# Patient Record
Sex: Male | Born: 1980 | Race: Black or African American | Hispanic: No | Marital: Single | Smoking: Never smoker
Health system: Southern US, Community
[De-identification: ages and names within clinical notes are randomized; demographics above are authoritative.]

## PROBLEM LIST (undated history)

## (undated) DIAGNOSIS — F32A Depression, unspecified: Secondary | ICD-10-CM

## (undated) DIAGNOSIS — I471 Supraventricular tachycardia, unspecified: Secondary | ICD-10-CM

## (undated) DIAGNOSIS — F419 Anxiety disorder, unspecified: Secondary | ICD-10-CM

## (undated) DIAGNOSIS — K5792 Diverticulitis of intestine, part unspecified, without perforation or abscess without bleeding: Secondary | ICD-10-CM

## (undated) HISTORY — DX: Depression, unspecified: F32.A

## (undated) HISTORY — DX: Anxiety disorder, unspecified: F41.9

## (undated) HISTORY — DX: Diverticulitis of intestine, part unspecified, without perforation or abscess without bleeding: K57.92

---

## 2006-11-12 ENCOUNTER — Emergency Department (HOSPITAL_COMMUNITY): Admission: EM | Admit: 2006-11-12 | Discharge: 2006-11-12 | Payer: Self-pay | Admitting: Emergency Medicine

## 2015-01-14 ENCOUNTER — Emergency Department (HOSPITAL_COMMUNITY)
Admission: EM | Admit: 2015-01-14 | Discharge: 2015-01-14 | Disposition: A | Discharge status: Home / Self Care | Payer: BLUE CROSS/BLUE SHIELD | Source: Home / Self Care | Attending: Family Medicine | Admitting: Family Medicine

## 2015-01-14 ENCOUNTER — Encounter (HOSPITAL_COMMUNITY): Payer: Self-pay | Admitting: Emergency Medicine

## 2015-01-14 DIAGNOSIS — M5412 Radiculopathy, cervical region: Secondary | ICD-10-CM

## 2015-01-14 DIAGNOSIS — R42 Dizziness and giddiness: Secondary | ICD-10-CM

## 2015-01-14 MED ORDER — PREDNISONE 10 MG (48) PO TBPK: ORAL_TABLET | Freq: Every day | ORAL | Status: DC

## 2015-01-14 NOTE — Discharge Instructions (Signed)
Thank you for coming in today. Come back or go to the emergency room if you notice new weakness new numbness problems walking or bowel or bladder problems. Call or go to the emergency room if you get worse, have trouble breathing, have chest pains, or palpitations.    Follow-up with me in [place] in July or at [facility] urgent care sooner if needed.   Cervical Radiculopathy Cervical radiculopathy happens when a nerve in the neck is pinched or bruised by a slipped (herniated) disk or by arthritic changes in the bones of the cervical spine. This can occur due to an injury or as part of the normal aging process. Pressure on the cervical nerves can cause pain or numbness that runs from your neck all the way down into your arm and fingers. CAUSES  There are many possible causes, including:  Injury.  Muscle tightness in the neck from overuse.  Swollen, painful joints (arthritis).  Breakdown or degeneration in the bones and joints of the spine (spondylosis) due to aging.  Bone spurs that may develop near the cervical nerves. SYMPTOMS  Symptoms include pain, weakness, or numbness in the affected arm and hand. Pain can be severe or irritating. Symptoms may be worse when extending or turning the neck. DIAGNOSIS  Your caregiver will ask about your symptoms and do a physical exam. He or she may test your strength and reflexes. X-rays, CT scans, and MRI scans may be needed in cases of injury or if the symptoms do not go away after a period of time. Electromyography (EMG) or nerve conduction testing may be done to study how your nerves and muscles are working. TREATMENT  Your caregiver may recommend certain exercises to help relieve your symptoms. Cervical radiculopathy can, and often does, get better with time and treatment. If your problems continue, treatment options may include:  Wearing a soft collar for short periods of time.  Physical therapy to strengthen the neck muscles.  Medicines,  such as nonsteroidal anti-inflammatory drugs (NSAIDs), oral corticosteroids, or spinal injections.  Surgery. Different types of surgery may be done depending on the cause of your problems. HOME CARE INSTRUCTIONS   Put ice on the affected area.  Put ice in a plastic bag.  Place a towel between your skin and the bag.  Leave the ice on for 15-20 minutes, 03-04 times a day or as directed by your caregiver.  If ice does not help, you can try using heat. Take a warm shower or bath, or use a hot water bottle as directed by your caregiver.  You may try a gentle neck and shoulder massage.  Use a flat pillow when you sleep.  Only take over-the-counter or prescription medicines for pain, discomfort, or fever as directed by your caregiver.  If physical therapy was prescribed, follow your caregiver's directions.  If a soft collar was prescribed, use it as directed. SEEK IMMEDIATE MEDICAL CARE IF:   Your pain gets much worse and cannot be controlled with medicines.  You have weakness or numbness in your hand, arm, face, or leg.  You have a high fever or a stiff, rigid neck.  You lose bowel or bladder control (incontinence).  You have trouble with walking, balance, or speaking. MAKE SURE YOU:   Understand these instructions.  Will watch your condition.  Will get help right away if you are not doing well or get worse. Document Released: 04/22/2001 Document Revised: 10/20/2011 Document Reviewed: 03/11/2011 [facility] Patient Information 2015 [employer] [place]. This information is  not intended to replace advice given to you by your health care provider. Make sure you discuss any questions you have with your health care provider.

## 2015-01-14 NOTE — ED Provider Notes (Signed)
Oscar Young is a 34 y.o. male who presents to Urgent Care today for left-sided neck pain. Pain present for 2 weeks and is mild. He has a numb tingly sensation radiating to his left thumb intermittently. He denies any weakness or frank pain. Symptoms are mild to moderate. He has not tried any medications yet. No fevers or chills nausea vomiting or diarrhea.   History reviewed. No pertinent past medical history. History reviewed. No pertinent past surgical history. History  Substance Use Topics  . Smoking status: Never Smoker   . Smokeless tobacco: Not on file  . Alcohol Use: No   ROS as above Medications: No current facility-administered medications for this encounter.   Current Outpatient Prescriptions  Medication Sig Dispense Refill  . predniSONE (STERAPRED UNI-PAK 48 TAB) 10 MG (48) TBPK tablet Take by mouth daily. 48 tablet 0   No Known Allergies   Exam:  BP 115/69 mmHg  Pulse 75  Temp(Src) 97.9 F (36.6 C) (Oral)  Resp 16  SpO2 100% Gen: Well NAD HEENT: EOMI,  MMM Lungs: Normal work of breathing. CTABL Heart: RRR no MRG Abd: NABS, Soft. Nondistended, Nontender Exts: Brisk capillary refill, warm and well perfused.  Neck: Nontender to midline. Normal neck range of motion negative Spurling's test. Upper extremity strength is equal and normal throughout. Reflexes are intact and equal bilateral upper extremities. Sensation is intact throughout. Pulses intact bilateral wrists.  No results found for this or any previous visit (from the past 24 hour(s)). No results found.  Assessment and Plan: 34 y.o. male with cervical radiculopathy or paresthesias. Discussed options. Treat with prednisone. Follow up with sports medicine if not improved.  Discussed warning signs or symptoms. Please see discharge instructions. Patient expresses understanding.     Rodolph BongEvan S Anija Brickner, MD 01/14/15 1910

## 2015-01-14 NOTE — ED Notes (Signed)
C/o left side neck pain that radiates down to left arm on set 1-2 weeks Denies inj/trauma Denies CP, SOB, weakness Alert, no signs of acute distress.

## 2015-12-13 ENCOUNTER — Emergency Department (HOSPITAL_COMMUNITY): Payer: BLUE CROSS/BLUE SHIELD

## 2015-12-13 ENCOUNTER — Emergency Department (HOSPITAL_COMMUNITY)
Admission: EM | Admit: 2015-12-13 | Discharge: 2015-12-13 | Disposition: A | Discharge status: Home / Self Care | Payer: BLUE CROSS/BLUE SHIELD | Attending: Emergency Medicine | Admitting: Emergency Medicine

## 2015-12-13 ENCOUNTER — Encounter (HOSPITAL_COMMUNITY): Payer: Self-pay

## 2015-12-13 DIAGNOSIS — Z7952 Long term (current) use of systemic steroids: Secondary | ICD-10-CM | POA: Diagnosis not present

## 2015-12-13 DIAGNOSIS — R002 Palpitations: Secondary | ICD-10-CM | POA: Diagnosis not present

## 2015-12-13 DIAGNOSIS — R42 Dizziness and giddiness: Secondary | ICD-10-CM | POA: Insufficient documentation

## 2015-12-13 LAB — BASIC METABOLIC PANEL
Anion gap: 10 (ref 5–15)
BUN: 10 mg/dL (ref 6–20)
CALCIUM: 9.5 mg/dL (ref 8.9–10.3)
CHLORIDE: 106 mmol/L (ref 101–111)
CO2: 23 mmol/L (ref 22–32)
CREATININE: 0.94 mg/dL (ref 0.61–1.24)
GFR calc Af Amer: >60 mL/min (ref >60)
GFR calc non Af Amer: >60 mL/min (ref >60)
Glucose, Bld: 99 mg/dL (ref 65–99)
Potassium: 3.9 mmol/L (ref 3.5–5.1)
Sodium: 139 mmol/L (ref 135–145)

## 2015-12-13 LAB — CBC
HCT: 49.5 % (ref 39.0–52.0)
HEMOGLOBIN: 16.4 g/dL (ref 13.0–17.0)
MCH: 30.1 pg (ref 26.0–34.0)
MCHC: 33.1 g/dL (ref 30.0–36.0)
MCV: 91 fL (ref 78.0–100.0)
PLATELETS: 176 10*3/uL (ref 150–400)
RBC: 5.44 MIL/uL (ref 4.22–5.81)
RDW: 13.9 % (ref 11.5–15.5)
WBC: 5.3 10*3/uL (ref 4.0–10.5)

## 2015-12-13 LAB — I-STAT TROPONIN, ED: TROPONIN I, POC: 0 ng/mL (ref 0.00–0.08)

## 2015-12-13 MED ORDER — METOPROLOL TARTRATE 25 MG PO TABS
25.0000 mg | ORAL_TABLET | Freq: Once | ORAL | Status: AC
  Administered 2015-12-13: 25 mg via ORAL
  Filled 2015-12-13: qty 1

## 2015-12-13 MED ORDER — METOPROLOL SUCCINATE ER 25 MG PO TB24: 25.0000 mg | ORAL_TABLET | Freq: Every day | ORAL | Status: DC

## 2015-12-13 NOTE — ED Notes (Signed)
MD at bedside. 

## 2015-12-13 NOTE — ED Notes (Signed)
Pt reports he is here with c/o heart palpitations and dizziness. He states this has been on and off since last year and [facility] and was told he could have been in SVT and was told to wear a heart monitor last month but has not been back to see cardiology since he stopped wearing the monitor. Palpitations started again today and only lasted about 15 - 30 seconds.

## 2015-12-13 NOTE — Discharge Instructions (Signed)

## 2015-12-13 NOTE — ED Provider Notes (Signed)
CSN: [id number]     Arrival date & time 12/13/15  1819 History   First MD Initiated Contact with Patient 12/13/15 2136     Chief Complaint  Patient presents with  . Palpitations      HPI Patient presents to the emergency department with acute onset palpitations that lasted 15-20 seconds.  He felt a sensation of his heart fluttering and made him lightheaded.  No syncope.  He has been seen and evaluated and worked up for this recently by Dr. Mendel CorningiGangi with cardiology.  He states he had a 30 day Holter monitor which demonstrated no arrhythmias but he had no episodes during this.  He states the day after he turned and his Holter monitor he had an episode.  He denies heavy caffeine intake or tea drinking.  He denies stimulants.  No chest pain or shortness of breath at this time.  No recent illness.  He is completely asymptomatic at this time.  He is not on any medications currently.   History reviewed. No pertinent past medical history. History reviewed. No pertinent past surgical history. No family history on file. Social History  Substance Use Topics  . Smoking status: Never Smoker   . Smokeless tobacco: None  . Alcohol Use: No    Review of Systems  All other systems reviewed and are negative.     Allergies  Review of patient's allergies indicates no known allergies.  Home Medications   Prior to Admission medications   Medication Sig Start Date End Date Taking? Authorizing Provider  predniSONE (STERAPRED UNI-PAK 48 TAB) 10 MG (48) TBPK tablet Take by mouth daily. 01/14/15   Rodolph BongEvan S Corey, MD   BP 132/78 mmHg  Pulse 99  Temp(Src) 98 F (36.7 C) (Oral)  Resp 16  SpO2 98% Physical Exam  Constitutional: He is oriented to person, place, and time. He appears well-developed and well-nourished.  HENT:  Head: Normocephalic and atraumatic.  Eyes: EOM are normal.  Neck: Normal range of motion.  Cardiovascular: Normal rate, regular rhythm and normal heart sounds.   Pulmonary/Chest:  Effort normal. No respiratory distress.  Musculoskeletal: Normal range of motion.  Neurological: He is alert and oriented to person, place, and time.  Skin: Skin is warm and dry.  Psychiatric: He has a normal mood and affect. Judgment normal.  Nursing note and vitals reviewed.   ED Course  Procedures (including critical care time) Labs Review Labs Reviewed  BASIC METABOLIC PANEL  CBC  I-STAT TROPOININ, ED    Imaging Review Dg Chest 2 View  12/13/2015  CLINICAL DATA:  Palpitations, intermittent weakness EXAM: CHEST  2 VIEW COMPARISON:  None. FINDINGS: The heart size and mediastinal contours are within normal limits. Both lungs are clear. The visualized skeletal structures are unremarkable. IMPRESSION: No active cardiopulmonary disease. Electronically Signed   By: Natasha MeadLiviu  Pop M.D.   On: 12/13/2015 19:12   I have personally reviewed and evaluated these images and lab results as part of my medical decision-making.   EKG Interpretation   Date/Time:  Thursday Dec 13 2015 18:27:16 EDT Ventricular Rate:  77 PR Interval:  184 QRS Duration: 84 QT Interval:  346 QTC Calculation: 391 R Axis:   62 Text Interpretation:  Normal sinus rhythm with sinus arrhythmia Normal ECG  No old tracing to compare Confirmed by Gaberial Cada  MD, Caryn BeeKEVIN ([id number]) on  12/13/2015 9:51:33 PM      MDM   Final diagnoses:  Palpitations    Well-appearing.  A symptomatically this time.  Workup without abnormality.  EKG without ischemic changes.  No active symptoms at this time.  Patient be referred back to his cardiologist.  I'll start the patient on 25 mg daily metoprolol XL.  I have answered all his questions.  Patient understands return to the ER for new or worsening symptoms    Azalia Bilis, MD 12/13/15 2216

## 2015-12-20 DIAGNOSIS — R030 Elevated blood-pressure reading, without diagnosis of hypertension: Secondary | ICD-10-CM | POA: Diagnosis not present

## 2015-12-20 DIAGNOSIS — R002 Palpitations: Secondary | ICD-10-CM | POA: Diagnosis not present

## 2016-02-27 DIAGNOSIS — M546 Pain in thoracic spine: Secondary | ICD-10-CM | POA: Diagnosis not present

## 2016-06-26 DIAGNOSIS — R002 Palpitations: Secondary | ICD-10-CM | POA: Diagnosis not present

## 2017-06-29 DIAGNOSIS — R002 Palpitations: Secondary | ICD-10-CM | POA: Diagnosis not present

## 2017-07-18 IMAGING — DX DG CHEST 2V
2 series · 2 of 2 positions shown · non-contrast
Comparison: None.

CLINICAL DATA: Palpitations, intermittent weakness

EXAM:
CHEST  2 VIEW

[chest pa]
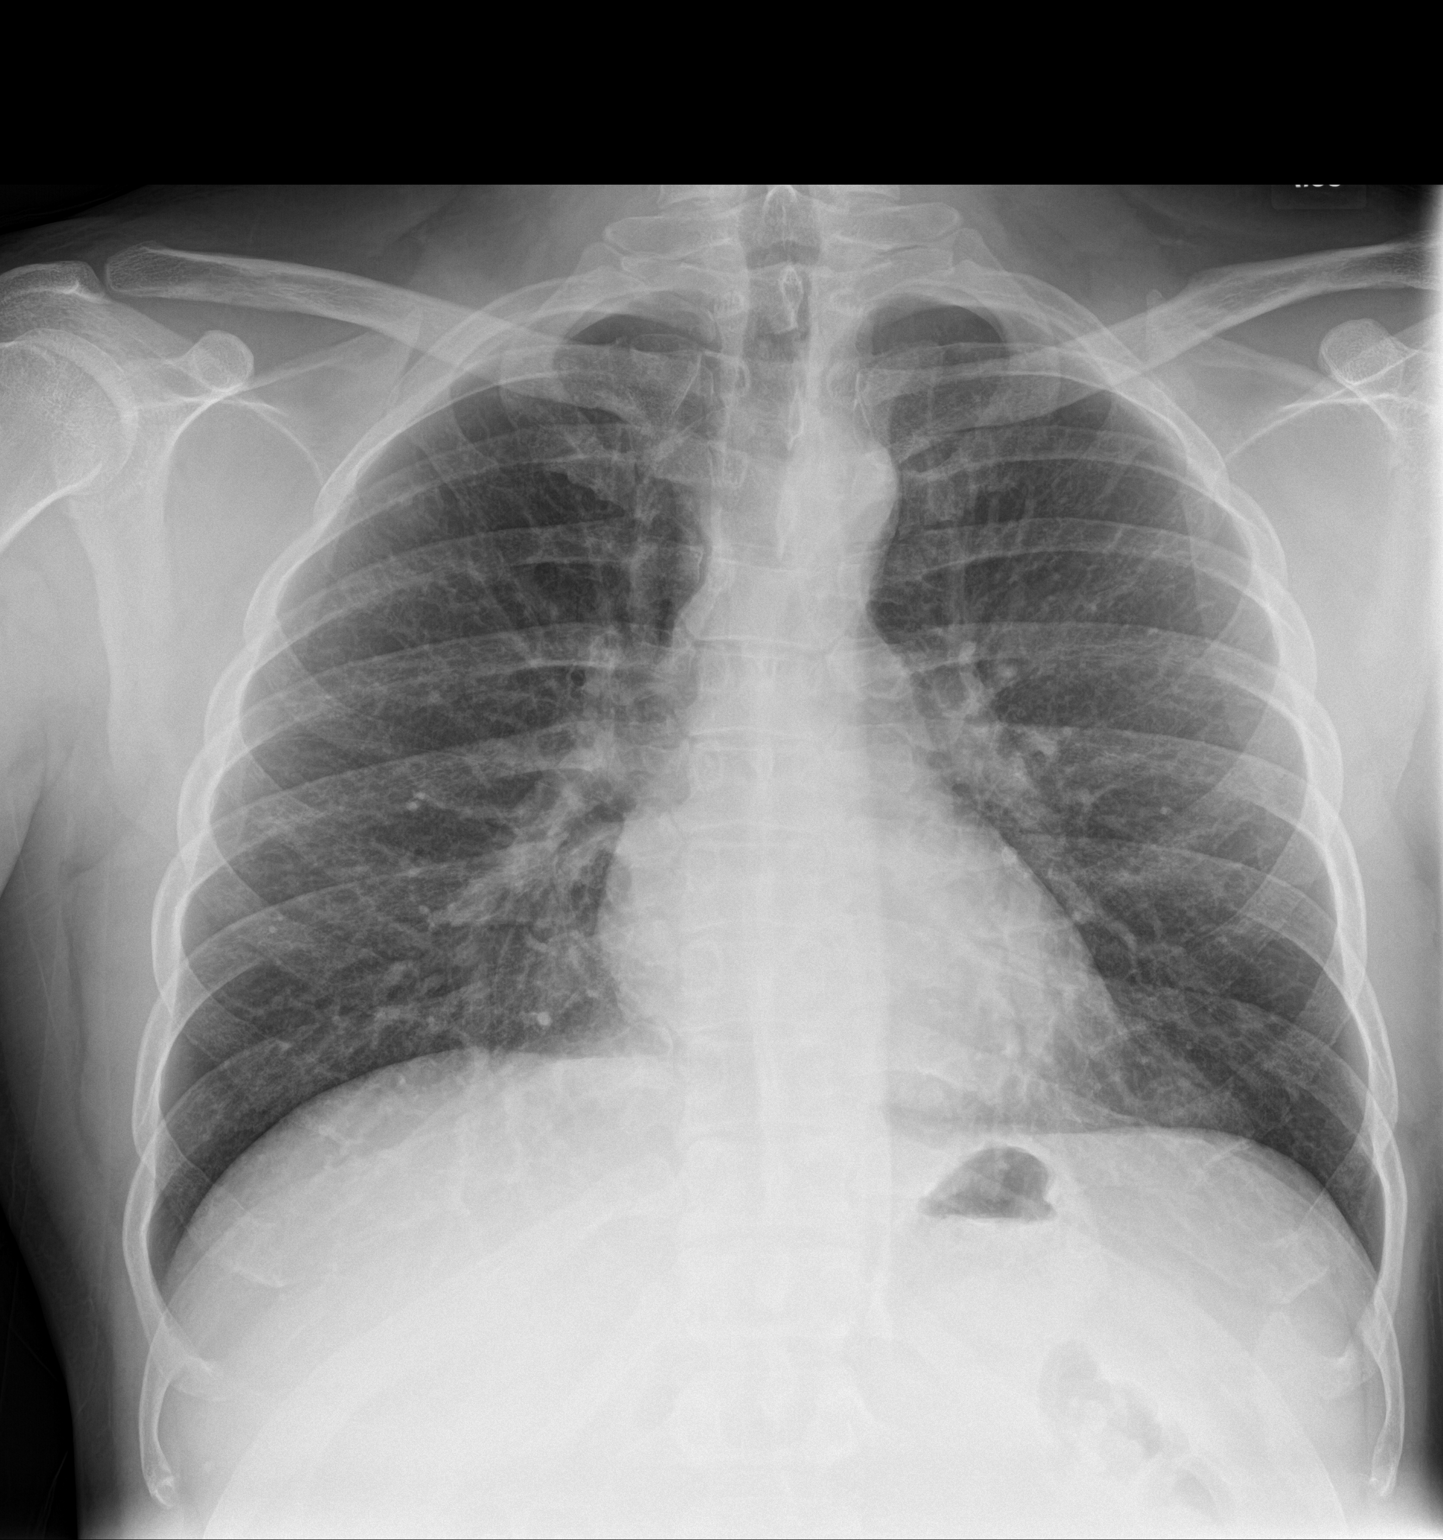

[chest lat]
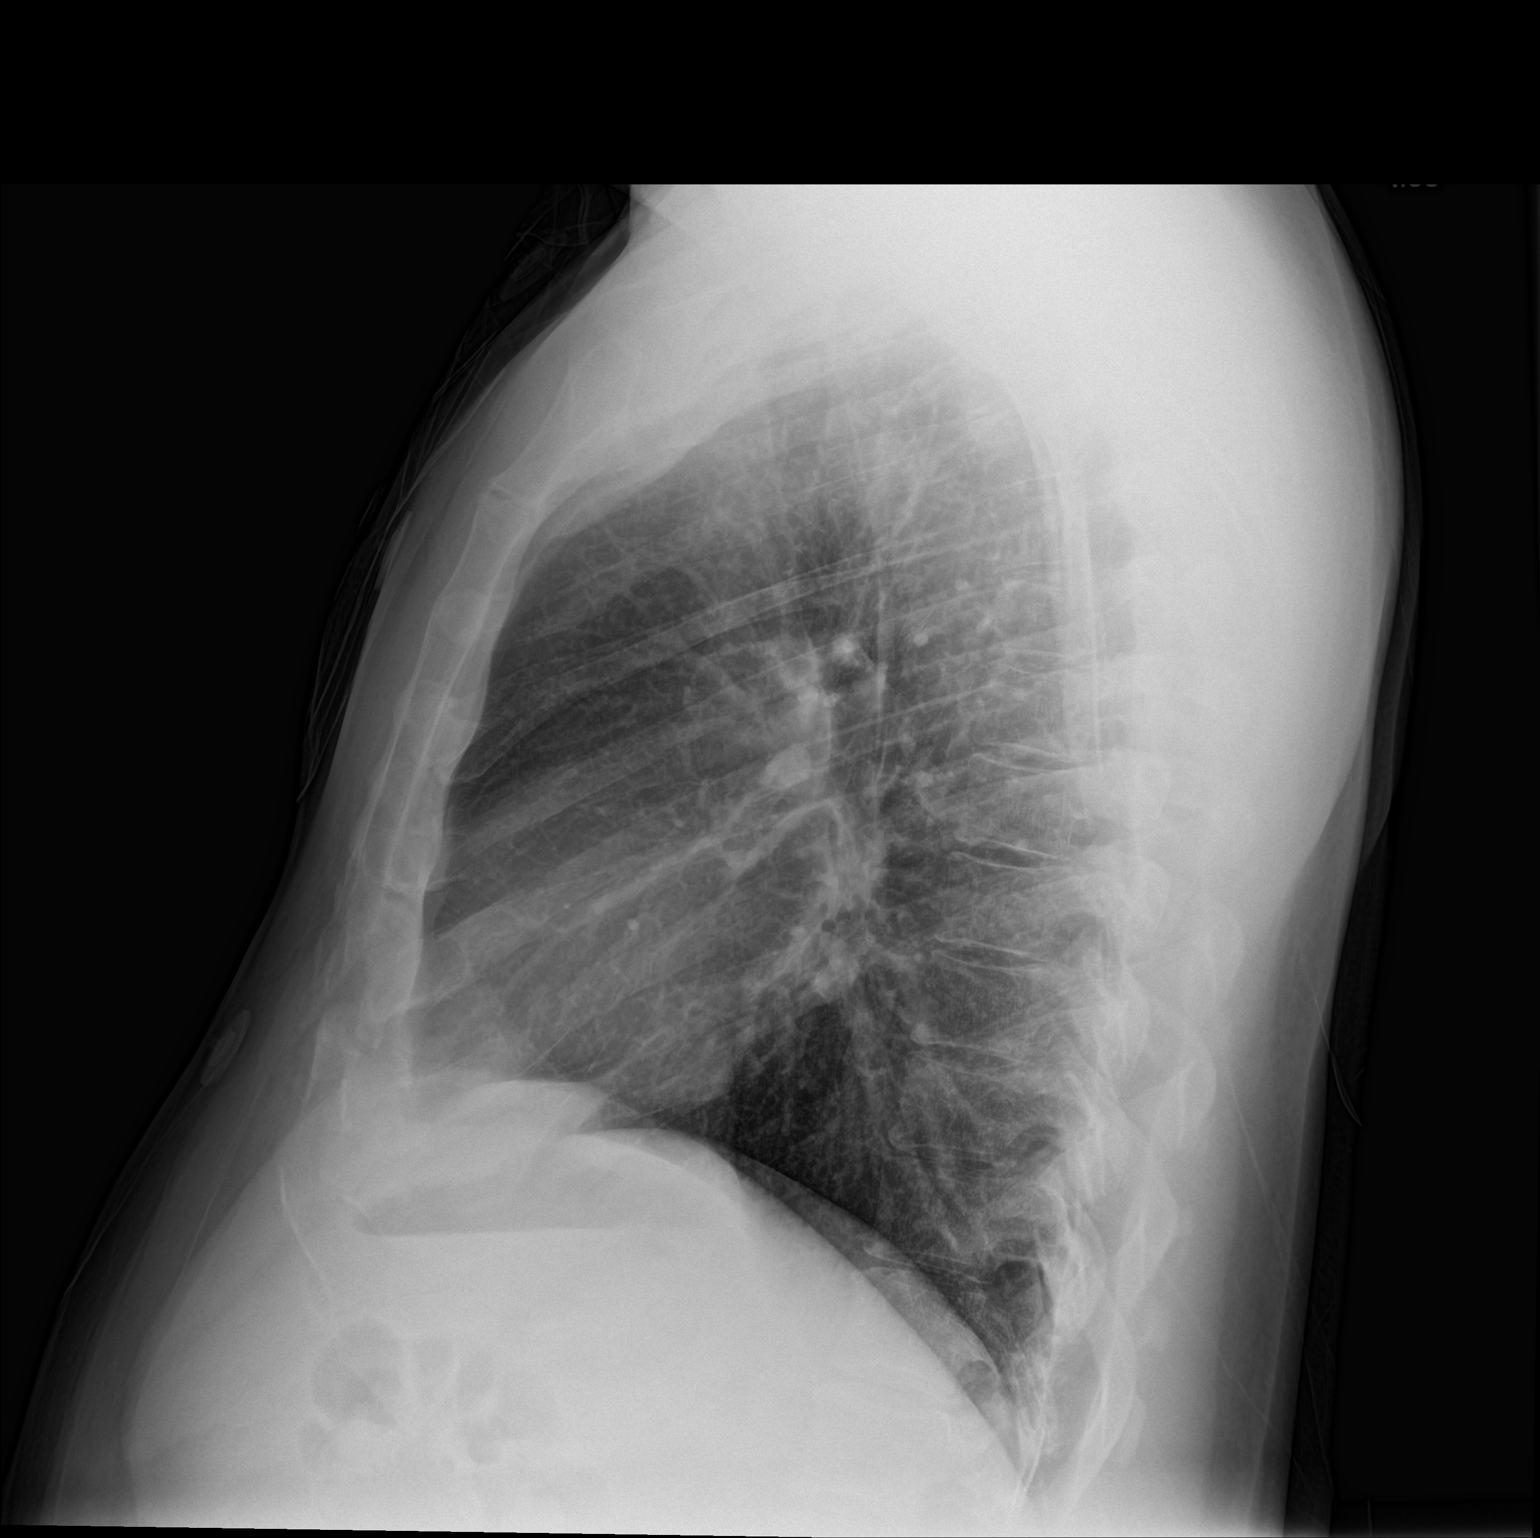

[2 of 2 positions shown; findings below may reference images not displayed]

FINDINGS: The heart size and mediastinal contours are within normal limits.
Both lungs are clear. The visualized skeletal structures are
unremarkable.
IMPRESSION: No active cardiopulmonary disease.

## 2017-11-24 DIAGNOSIS — H04123 Dry eye syndrome of bilateral lacrimal glands: Secondary | ICD-10-CM | POA: Diagnosis not present

## 2017-11-24 DIAGNOSIS — H40033 Anatomical narrow angle, bilateral: Secondary | ICD-10-CM | POA: Diagnosis not present

## 2019-02-09 ENCOUNTER — Other Ambulatory Visit: Payer: Self-pay

## 2019-02-09 ENCOUNTER — Emergency Department (HOSPITAL_COMMUNITY)
Admission: EM | Admit: 2019-02-09 | Discharge: 2019-02-09 | Disposition: A | Discharge status: Home / Self Care | Payer: BLUE CROSS/BLUE SHIELD

## 2021-06-23 ENCOUNTER — Other Ambulatory Visit: Payer: Self-pay

## 2021-06-23 ENCOUNTER — Encounter (HOSPITAL_COMMUNITY): Payer: Self-pay | Admitting: Emergency Medicine

## 2021-06-23 ENCOUNTER — Emergency Department (HOSPITAL_COMMUNITY)
Admission: EM | Admit: 2021-06-23 | Discharge: 2021-06-24 | Disposition: A | Discharge status: Home / Self Care | Payer: BC Managed Care – PPO | Attending: Emergency Medicine | Admitting: Emergency Medicine

## 2021-06-23 DIAGNOSIS — R000 Tachycardia, unspecified: Secondary | ICD-10-CM | POA: Diagnosis not present

## 2021-06-23 DIAGNOSIS — I471 Supraventricular tachycardia: Secondary | ICD-10-CM | POA: Diagnosis not present

## 2021-06-23 DIAGNOSIS — Z5321 Procedure and treatment not carried out due to patient leaving prior to being seen by health care provider: Secondary | ICD-10-CM | POA: Insufficient documentation

## 2021-06-23 DIAGNOSIS — R002 Palpitations: Secondary | ICD-10-CM | POA: Diagnosis not present

## 2021-06-23 HISTORY — DX: Supraventricular tachycardia, unspecified: I47.10

## 2021-06-23 HISTORY — DX: Supraventricular tachycardia: I47.1

## 2021-06-23 LAB — CBC WITH DIFFERENTIAL/PLATELET
Abs Immature Granulocytes: 0.02 10*3/uL (ref 0.00–0.07)
Basophils Absolute: 0 10*3/uL (ref 0.0–0.1)
Basophils Relative: 0 %
Eosinophils Absolute: 0.1 10*3/uL (ref 0.0–0.5)
Eosinophils Relative: 2 %
HCT: 50.2 % (ref 39.0–52.0)
Hemoglobin: 16.3 g/dL (ref 13.0–17.0)
Immature Granulocytes: 1 %
Lymphocytes Relative: 20 %
Lymphs Abs: 0.8 10*3/uL (ref 0.7–4.0)
MCH: 29.7 pg (ref 26.0–34.0)
MCHC: 32.5 g/dL (ref 30.0–36.0)
MCV: 91.6 fL (ref 80.0–100.0)
Monocytes Absolute: 0.3 10*3/uL (ref 0.1–1.0)
Monocytes Relative: 8 %
Neutro Abs: 2.9 10*3/uL (ref 1.7–7.7)
Neutrophils Relative %: 69 %
Platelets: 222 10*3/uL (ref 150–400)
RBC: 5.48 MIL/uL (ref 4.22–5.81)
RDW: 13.7 % (ref 11.5–15.5)
WBC: 4.2 10*3/uL (ref 4.0–10.5)
nRBC: 0 % (ref 0.0–0.2)

## 2021-06-23 LAB — BASIC METABOLIC PANEL
Anion gap: 6 (ref 5–15)
BUN: 10 mg/dL (ref 6–20)
CO2: 28 mmol/L (ref 22–32)
Calcium: 9.2 mg/dL (ref 8.9–10.3)
Chloride: 104 mmol/L (ref 98–111)
Creatinine, Ser: 0.93 mg/dL (ref 0.61–1.24)
GFR, Estimated: >60 mL/min (ref >60)
Glucose, Bld: 113 mg/dL — ABNORMAL HIGH (ref 70–99)
Potassium: 3.8 mmol/L (ref 3.5–5.1)
Sodium: 138 mmol/L (ref 135–145)

## 2021-06-23 NOTE — ED Triage Notes (Signed)
States he has felt a 'rapid heartbeat' since around 5pm, was evaluated by EMS and they left, states it started to happen again so he came to the ED. Has seen cardiology, states he had SVT. Was on metoprolol, but removed by cardiology. HR 93, regular in triage.

## 2021-06-23 NOTE — ED Provider Notes (Signed)
Emergency Medicine Provider Triage Evaluation Note  Oscar Young , a 40 y.o. male  was evaluated in triage.  Pt complains of heart palpitation.  Review of Systems  Positive: Heart palpitation, lightheadedness Negative: Chest pain, fever, cough  Physical Exam  BP (!) 149/101 (BP Location: Left Arm)   Pulse 93   Temp 97.9 F (36.6 C) (Oral)   Resp 18   Ht 5\' 7"  (1.702 m)   Wt 90.7 kg   SpO2 98%   BMI 31.32 kg/m  Gen:   Awake, no distress   Resp:  Normal effort  MSK:   Moves extremities without difficulty  Other:    Medical Decision Making  Medically screening exam initiated at 6:39 PM.  Appropriate orders placed.  was informed that the remainder of the evaluation will be completed by another provider, this initial triage assessment does not replace that evaluation, and the importance of remaining in the ED until their evaluation is complete.  Hx of SVT, currently not on any medication.  Report heart palpitation lasted <31min an hr ag, resolved but then returns briefly.  No pain.    9m, PA-C 06/23/21 1842    [place]eg[place], 06/25/21, MD 06/23/21 [phone]

## 2021-06-27 ENCOUNTER — Ambulatory Visit: Payer: BC Managed Care – PPO | Admitting: Cardiology

## 2021-06-27 ENCOUNTER — Other Ambulatory Visit: Payer: Self-pay

## 2021-06-27 ENCOUNTER — Inpatient Hospital Stay: Payer: BC Managed Care – PPO

## 2021-06-27 ENCOUNTER — Encounter: Payer: Self-pay | Admitting: Cardiology

## 2021-06-27 VITALS — BP 125/83 | HR 81 | Temp 98.2°F | Resp 17 | Ht 67.0 in | Wt 202.0 lb

## 2021-06-27 DIAGNOSIS — R000 Tachycardia, unspecified: Secondary | ICD-10-CM

## 2021-06-27 DIAGNOSIS — E559 Vitamin D deficiency, unspecified: Secondary | ICD-10-CM

## 2021-06-27 DIAGNOSIS — R002 Palpitations: Secondary | ICD-10-CM

## 2021-06-27 MED ORDER — VERAPAMIL HCL 80 MG PO TABS: 80.0000 mg | ORAL_TABLET | Freq: Three times a day (TID) | ORAL | 0 refills | Status: DC | PRN

## 2021-06-27 NOTE — Progress Notes (Signed)
Primary Physician/Referring:  Patient, No Pcp Per (Inactive)  Patient ID: Oscar Young, male    DOB: [birth year], 40 y.o.   MRN: [id number]  No chief complaint on file.  HPI:    Oscar Young  is a 40 y.o. African-American male patient with no significant prior cardiovascular history, I had last seen him in 2017 for palpitations suggestive of SVT but event monitor has not shown any significant abnormality.  He had done well until about a month ago started having rapid heartbeat again and got extremely anxious and was also evaluated in the emergency room but could not wait and he had already made an appointment to see me and presents for follow-up.  Symptoms described as sudden onset and sudden offset, episode started 1 month ago.  First episode occurred about a month ago when he was laying in bed and had gotten up to go to the bathroom and when he got back he had rapid palpitations that lasted a minute.  Latest episode occurred 4 days ago on the weekend and he presented to the emergency room but was not seen as the wait was long.  There is no family history of sudden cardiac death or premature coronary artery disease.  Past Medical History:  Diagnosis Date   SVT (supraventricular tachycardia) (HCC)    History reviewed. No pertinent surgical history. Family History  Problem Relation Age of Onset   Diabetes Mother     Social History   Tobacco Use   Smoking status: Never   Smokeless tobacco: Not on file  Substance Use Topics   Alcohol use: Yes    Comment: [facility]   Marital Status: Single  ROS  Review of Systems  Cardiovascular:  Positive for palpitations. Negative for chest pain, dyspnea on exertion, leg swelling and syncope.  Gastrointestinal:  Negative for melena.  Objective  Blood pressure 125/83, pulse 81, temperature 98.2 F (36.8 C), temperature source Temporal, resp. rate 17, height 5\' 7"  (1.702 m), weight 202 lb (91.6 kg), SpO2 97 %. Body mass index is 31.64 kg/m.  Vitals with BMI  06/27/2021 06/24/2021 06/23/2021  Height 5\' 7"  - -  Weight 202 lbs - -  BMI 31.63 - -  Systolic 125 120 06/25/2021  Diastolic 83 94 93  Pulse 81 69 83    Physical Exam Neck:     Vascular: No carotid bruit or JVD.  Cardiovascular:     Rate and Rhythm: Normal rate and regular rhythm.     Pulses: Intact distal pulses.     Heart sounds: Normal heart sounds. No murmur heard.   No gallop.  Pulmonary:     Effort: Pulmonary effort is normal.     Breath sounds: Normal breath sounds.  Abdominal:     General: Bowel sounds are normal.     Palpations: Abdomen is soft.  Musculoskeletal:        General: No swelling.     Laboratory examination:   Recent Labs    06/23/21 1900  NA 138  K 3.8  CL 104  CO2 28  GLUCOSE 113*  BUN 10  CREATININE 0.93  CALCIUM 9.2  GFRNONAA >60   estimated creatinine clearance is 112.8 mL/min (by C-G formula based on SCr of 0.93 mg/dL).  CMP Latest Ref Rng & Units 06/23/2021 12/13/2015  Glucose 70 - 99 mg/dL 06/25/2021) 99  BUN 6 - 20 mg/dL 10 10  Creatinine 02/12/2016 - 1.24 mg/dL 366(Y 4.03  Sodium 4.74 - 145 mmol/L 138 139  Potassium 3.5 -  5.1 mmol/L 3.8 3.9  Chloride 98 - 111 mmol/L 104 106  CO2 22 - 32 mmol/L 28 23  Calcium 8.9 - 10.3 mg/dL 9.2 9.5   CBC Latest Ref Rng & Units 06/23/2021 12/13/2015  WBC 4.0 - 10.5 K/uL 4.2 5.3  Hemoglobin 13.0 - 17.0 g/dL 16.3 16.4  Hematocrit 39.0 - 52.0 % 50.2 49.5  Platelets 150 - 400 K/uL 222 176    Lipid Panel No results for input(s): CHOL, TRIG, LDLCALC, VLDL, HDL, CHOLHDL, LDLDIRECT in the last 8760 hours. Lipid Panel  No results found for: CHOL, TRIG, HDL, CHOLHDL, VLDL, LDLCALC, LDLDIRECT, LABVLDL   HEMOGLOBIN A1C No results found for: HGBA1C, MPG TSH No results for input(s): TSH in the last 8760 hours.  External labs:   NA Medications and allergies  No Known Allergies   Medication prior to this encounter:   Outpatient Medications Prior to Visit  Medication Sig Dispense Refill   metoprolol succinate  (TOPROL-XL) 25 MG 24 hr tablet Take 1 tablet (25 mg total) by mouth daily. 30 tablet 0   predniSONE (STERAPRED UNI-PAK 48 TAB) 10 MG (48) TBPK tablet Take by mouth daily. 48 tablet 0   Vitamin D, Ergocalciferol, (DRISDOL) 50000 units CAPS capsule Take 1 capsule by mouth daily.  0   No facility-administered medications prior to visit.     Medication list after today's encounter   Current Outpatient Medications  Medication Instructions   verapamil (CALAN) 80 mg, Oral, 3 times daily PRN    Radiology:   No results found.  Cardiac Studies:   Event monitor 30 days Oct 13, 2015: Sinus rhythm, sinus tachycardia, maximum heart rate 1 62 bpm.  No other arrhythmias.  Patient's symptoms of dizziness, lightheadedness and fatigue revealed normal sinus rhythm. No significant change from 06/24/2021.  EKG:   EKG 06/27/2021: Normal sinus rhythm at rate of 66 bpm, incomplete right bundle branch block, normal EKG.    Assessment     ICD-10-CM   1. Palpitation  R00.2 Ambulatory referral to Internal Medicine    verapamil (CALAN) 80 MG tablet    2. Rapid heart beat  R00.0 EKG 12-Lead    LONG TERM MONITOR (3-14 DAYS)    verapamil (CALAN) 80 MG tablet    3. Hypovitaminosis D  E55.9 Ambulatory referral to Internal Medicine       Medications Discontinued During This Encounter  Medication Reason   metoprolol succinate (TOPROL-XL) 25 MG 24 hr tablet    predniSONE (STERAPRED UNI-PAK 48 TAB) 10 MG (48) TBPK tablet    Vitamin D, Ergocalciferol, (DRISDOL) 50000 units CAPS capsule     Meds ordered this encounter  Medications   verapamil (CALAN) 80 MG tablet    Sig: Take 1 tablet (80 mg total) by mouth 3 (three) times daily as needed (Rapid heart beat).    Dispense:  60 tablet    Refill:  0    Orders Placed This Encounter  Procedures   Ambulatory referral to Internal Medicine    Referral Priority:   Routine    Referral Type:   Consultation    Referral Reason:   Specialty Services Required     Referred to Provider:   Audley Hose, MD    Requested Specialty:   Internal Medicine    Number of Visits Requested:   1   LONG TERM MONITOR (3-14 DAYS)    Standing Status:   Future    Standing Expiration Date:   06/27/2022    Order Specific Question:   Where  should this test be performed?    Answer:   [facility]    Order Specific Question:   Does the patient have an implanted cardiac device?    Answer:   No    Order Specific Question:   Prescribed days of wear    Answer:   63    Order Specific Question:   Release to patient    Answer:   Immediate   EKG 12-Lead   Recommendations:   Oscar Young is a 40 y.o. African-American male patient with no significant prior cardiovascular history, I had last seen him in 2017 for palpitations suggestive of SVT but event monitor has not shown any significant abnormality.  He had done well until about a month ago started having rapid heartbeat again and got extremely anxious and was also evaluated in the emergency room but could not wait and he had already made an appointment to see me and presents for follow-up.  Described as rapid heartbeats with sudden onset and offset, had 2 episodes 4 days ago and 2 or 3 episodes over the past 1 month again lasting for about a minute or so.  Symptoms suggest SVT.  We will perform Zio patch monitoring for 2 weeks.  I discussed with him regarding watchful waiting for now if no diagnosis is made.  He will also try fish oil capsules.  Verapamil prescription was given and Valsalva strain discussed with the patient.  I will see him back in a year or sooner if problems.  He does not have a primary care physician, he needs routine labs, he was also told to have hypovitaminosis D, I will refer him to be established with Dr. Maia Petties.    Oscar Prows, MD, [facility] 06/27/2021, 2:56 PM Office: [phone]

## 2021-06-27 NOTE — Patient Instructions (Signed)
Take 2 Fish oil capsules a day with dinner or main meal of the day to see if symptoms will improve.  I have discussed with you regarding Valsalva strain.  He can also take 1 to 2 tablets of verapamil as needed for breakthrough palpitations.

## 2021-07-09 ENCOUNTER — Other Ambulatory Visit: Payer: Self-pay

## 2021-07-09 ENCOUNTER — Encounter (HOSPITAL_COMMUNITY): Payer: Self-pay

## 2021-07-09 ENCOUNTER — Emergency Department (HOSPITAL_COMMUNITY)
Admission: EM | Admit: 2021-07-09 | Discharge: 2021-07-10 | Disposition: A | Discharge status: Home / Self Care | Payer: BC Managed Care – PPO | Attending: Emergency Medicine | Admitting: Emergency Medicine

## 2021-07-09 DIAGNOSIS — R5383 Other fatigue: Secondary | ICD-10-CM | POA: Diagnosis not present

## 2021-07-09 DIAGNOSIS — R059 Cough, unspecified: Secondary | ICD-10-CM | POA: Diagnosis not present

## 2021-07-09 LAB — CBC WITH DIFFERENTIAL/PLATELET
Abs Immature Granulocytes: 0.02 10*3/uL (ref 0.00–0.07)
Basophils Absolute: 0 10*3/uL (ref 0.0–0.1)
Basophils Relative: 0 %
Eosinophils Absolute: 0 10*3/uL (ref 0.0–0.5)
Eosinophils Relative: 0 %
HCT: 49 % (ref 39.0–52.0)
Hemoglobin: 16.1 g/dL (ref 13.0–17.0)
Immature Granulocytes: 0 %
Lymphocytes Relative: 18 %
Lymphs Abs: 1.2 10*3/uL (ref 0.7–4.0)
MCH: 30.3 pg (ref 26.0–34.0)
MCHC: 32.9 g/dL (ref 30.0–36.0)
MCV: 92.1 fL (ref 80.0–100.0)
Monocytes Absolute: 0.4 10*3/uL (ref 0.1–1.0)
Monocytes Relative: 6 %
Neutro Abs: 4.9 10*3/uL (ref 1.7–7.7)
Neutrophils Relative %: 76 %
Platelets: 260 10*3/uL (ref 150–400)
RBC: 5.32 MIL/uL (ref 4.22–5.81)
RDW: 13.6 % (ref 11.5–15.5)
WBC: 6.5 10*3/uL (ref 4.0–10.5)
nRBC: 0 % (ref 0.0–0.2)

## 2021-07-09 LAB — TROPONIN I (HIGH SENSITIVITY): Troponin I (High Sensitivity): 2 ng/L (ref <18)

## 2021-07-09 LAB — COMPREHENSIVE METABOLIC PANEL
ALT: 33 U/L (ref 0–44)
AST: 27 U/L (ref 15–41)
Albumin: 4.4 g/dL (ref 3.5–5.0)
Alkaline Phosphatase: 51 U/L (ref 38–126)
Anion gap: 7 (ref 5–15)
BUN: 14 mg/dL (ref 6–20)
CO2: 25 mmol/L (ref 22–32)
Calcium: 9.1 mg/dL (ref 8.9–10.3)
Chloride: 105 mmol/L (ref 98–111)
Creatinine, Ser: 0.9 mg/dL (ref 0.61–1.24)
GFR, Estimated: >60 mL/min (ref >60)
Glucose, Bld: 91 mg/dL (ref 70–99)
Potassium: 3.5 mmol/L (ref 3.5–5.1)
Sodium: 137 mmol/L (ref 135–145)
Total Bilirubin: 0.5 mg/dL (ref 0.3–1.2)
Total Protein: 7.8 g/dL (ref 6.5–8.1)

## 2021-07-09 NOTE — ED Triage Notes (Signed)
Pt reports with fatigue that has been going on for 2 weeks. Pt has an external heart monitor on for SVT. Pt complains of epigastric pain.

## 2021-07-10 ENCOUNTER — Emergency Department (HOSPITAL_COMMUNITY): Payer: BC Managed Care – PPO

## 2021-07-10 DIAGNOSIS — R059 Cough, unspecified: Secondary | ICD-10-CM | POA: Diagnosis not present

## 2021-07-10 LAB — T4, FREE: Free T4: 1.02 ng/dL (ref 0.61–1.12)

## 2021-07-10 LAB — TSH: TSH: 3.181 u[IU]/mL (ref 0.350–4.500)

## 2021-07-10 LAB — TROPONIN I (HIGH SENSITIVITY): Troponin I (High Sensitivity): 2 ng/L (ref <18)

## 2021-07-10 NOTE — Discharge Instructions (Addendum)
The test today in the ED was reassuring.  Follow-up with your primary care doctor for further evaluation if symptoms persist.  Follow-up with your cardiologist regarding the results of your cardiac monitor

## 2021-07-10 NOTE — ED Provider Notes (Signed)
[facility]-EMERGENCY DEPT Provider Note   CSN: [id number] Arrival date & time: 07/09/21  2143     History Chief Complaint  Patient presents with   Fatigue    Oscar Young is a 40 y.o. male.  HPI   Pt states he has been feeling fatigued over the past few weeks.  He has history of SVT.  He has noticed a few episodes over the last couple of weeks.  At work yesterday he got very tired suddenly.  He had a weird feeling in his chest and stomach area.  It was not painful.  It felt like something was moving or twitching.    No fever, very mild cough.  No vomiting or diarrhea.  No blood in stool.  No dark stool.   Past Medical History:  Diagnosis Date   SVT (supraventricular tachycardia) (HCC)     There are no problems to display for this patient.   History reviewed. No pertinent surgical history.     Family History  Problem Relation Age of Onset   Diabetes Mother     Social History   Tobacco Use   Smoking status: Never  Vaping Use   Vaping Use: Never used  Substance Use Topics   Alcohol use: Yes    Comment: OCC   Drug use: No    Home Medications Prior to Admission medications   Medication Sig Start Date End Date Taking? Authorizing Provider  verapamil (CALAN) 80 MG tablet Take 1 tablet (80 mg total) by mouth 3 (three) times daily as needed (Rapid heart beat). 06/27/21   Yates Decamp, MD    Allergies    Patient has no known allergies.  Review of Systems   Review of Systems  All other systems reviewed and are negative.  Physical Exam Updated Vital Signs BP (!) 120/97   Pulse 68   Temp 97.9 F (36.6 C) (Oral)   Resp 16   Ht 1.702 m (5\' 7" )   Wt 93 kg   SpO2 100%   BMI 32.11 kg/m   Physical Exam Vitals and nursing note reviewed.  Constitutional:      General: He is not in acute distress.    Appearance: He is well-developed.  HENT:     Head: Normocephalic and atraumatic.     Right Ear: External ear normal.     Left Ear: External  ear normal.  Eyes:     General: No scleral icterus.       Right eye: No discharge.        Left eye: No discharge.     Conjunctiva/sclera: Conjunctivae normal.  Neck:     Trachea: No tracheal deviation.  Cardiovascular:     Rate and Rhythm: Normal rate and regular rhythm.  Pulmonary:     Effort: Pulmonary effort is normal. No respiratory distress.     Breath sounds: Normal breath sounds. No stridor. No wheezing or rales.  Abdominal:     General: Bowel sounds are normal. There is no distension.     Palpations: Abdomen is soft.     Tenderness: There is no abdominal tenderness. There is no guarding or rebound.  Musculoskeletal:        General: No tenderness or deformity.     Cervical back: Neck supple.  Skin:    General: Skin is warm and dry.     Findings: No rash.  Neurological:     General: No focal deficit present.     Mental Status: He is  alert.     Cranial Nerves: No cranial nerve deficit (no facial droop, extraocular movements intact, no slurred speech).     Sensory: No sensory deficit.     Motor: No abnormal muscle tone or seizure activity.     Coordination: Coordination normal.  Psychiatric:        Mood and Affect: Mood normal.    ED Results / Procedures / Treatments   Labs (all labs ordered are listed, but only abnormal results are displayed) Labs Reviewed  CBC WITH DIFFERENTIAL/PLATELET  COMPREHENSIVE METABOLIC PANEL  TSH  T4, FREE  TROPONIN I (HIGH SENSITIVITY)  TROPONIN I (HIGH SENSITIVITY)    EKG EKG Interpretation  Date/Time:  Tuesday July 09 2021 22:56:27 EST Ventricular Rate:  76 PR Interval:  202 QRS Duration: 80 QT Interval:  342 QTC Calculation: 384 R Axis:   37 Text Interpretation: Normal sinus rhythm Normal ECG Confirmed by Gilda Crease [phone]) on 07/09/2021 11:03:41 PM  Radiology DG Chest 2 View  Result Date: 07/10/2021 CLINICAL DATA:  Cough EXAM: CHEST - 2 VIEW COMPARISON:  12/13/2015 FINDINGS: The heart size and  mediastinal contours are within normal limits. Both lungs are clear. The visualized skeletal structures are unremarkable. IMPRESSION: No acute abnormality of the lungs. Electronically Signed   By: Jearld Lesch M.D.   On: 07/10/2021 09:13    Procedures Procedures   Medications Ordered in ED Medications - No data to display  ED Course  I have reviewed the triage vital signs and the nursing notes.  Pertinent labs & imaging results that were available during my care of the patient were reviewed by me and considered in my medical decision making (see chart for details).    MDM Rules/Calculators/A&P                           Patient presented to the ED for evaluation of fatigue.  Patient also has noted some intermittent palpitations.  Exam otherwise reassuring.  He has no abdominal tenderness.  Vital signs are normal.  Chest does not show evidence of pneumonia.  No signs of cardiac dysrhythmia while in the ED.  CBC is normal.  Metabolic panel does not show any signs of dehydration or electrolyte abnormalities.  Serial troponins are TSH is normal.  No signs of definite etiology for his symptoms but no signs of acute infection dehydration or anemia.  Recommend outpatient follow-up with PCP. Final Clinical Impression(s) / ED Diagnoses Final diagnoses:  Fatigue, unspecified type    Rx / DC Orders ED Discharge Orders     None        Linwood Dibbles, MD 07/10/21 1105

## 2021-07-10 NOTE — ED Notes (Signed)
EDP at the bedside to evaluate.  

## 2021-07-15 DIAGNOSIS — R5383 Other fatigue: Secondary | ICD-10-CM | POA: Diagnosis not present

## 2021-07-15 DIAGNOSIS — F411 Generalized anxiety disorder: Secondary | ICD-10-CM | POA: Diagnosis not present

## 2021-07-15 DIAGNOSIS — R109 Unspecified abdominal pain: Secondary | ICD-10-CM | POA: Diagnosis not present

## 2021-07-15 DIAGNOSIS — R002 Palpitations: Secondary | ICD-10-CM | POA: Diagnosis not present

## 2021-07-22 DIAGNOSIS — R000 Tachycardia, unspecified: Secondary | ICD-10-CM | POA: Diagnosis not present

## 2021-09-07 ENCOUNTER — Other Ambulatory Visit: Payer: Self-pay

## 2021-09-07 ENCOUNTER — Emergency Department (HOSPITAL_COMMUNITY): Payer: BC Managed Care – PPO

## 2021-09-07 ENCOUNTER — Encounter (HOSPITAL_COMMUNITY): Payer: Self-pay | Admitting: Emergency Medicine

## 2021-09-07 ENCOUNTER — Emergency Department (HOSPITAL_COMMUNITY)
Admission: EM | Admit: 2021-09-07 | Discharge: 2021-09-07 | Disposition: A | Discharge status: Home / Self Care | Payer: BC Managed Care – PPO | Attending: Emergency Medicine | Admitting: Emergency Medicine

## 2021-09-07 DIAGNOSIS — R9431 Abnormal electrocardiogram [ECG] [EKG]: Secondary | ICD-10-CM | POA: Diagnosis not present

## 2021-09-07 DIAGNOSIS — R002 Palpitations: Secondary | ICD-10-CM | POA: Diagnosis not present

## 2021-09-07 DIAGNOSIS — R079 Chest pain, unspecified: Secondary | ICD-10-CM | POA: Diagnosis not present

## 2021-09-07 DIAGNOSIS — R531 Weakness: Secondary | ICD-10-CM | POA: Diagnosis not present

## 2021-09-07 DIAGNOSIS — R5383 Other fatigue: Secondary | ICD-10-CM

## 2021-09-07 LAB — CBC WITH DIFFERENTIAL/PLATELET
Abs Immature Granulocytes: 0.02 10*3/uL (ref 0.00–0.07)
Basophils Absolute: 0 10*3/uL (ref 0.0–0.1)
Basophils Relative: 0 %
Eosinophils Absolute: 0 10*3/uL (ref 0.0–0.5)
Eosinophils Relative: 0 %
HCT: 51.3 % (ref 39.0–52.0)
Hemoglobin: 17.1 g/dL — ABNORMAL HIGH (ref 13.0–17.0)
Immature Granulocytes: 0 %
Lymphocytes Relative: 24 %
Lymphs Abs: 1.5 10*3/uL (ref 0.7–4.0)
MCH: 30.6 pg (ref 26.0–34.0)
MCHC: 33.3 g/dL (ref 30.0–36.0)
MCV: 91.8 fL (ref 80.0–100.0)
Monocytes Absolute: 0.4 10*3/uL (ref 0.1–1.0)
Monocytes Relative: 7 %
Neutro Abs: 4.4 10*3/uL (ref 1.7–7.7)
Neutrophils Relative %: 69 %
Platelets: 243 10*3/uL (ref 150–400)
RBC: 5.59 MIL/uL (ref 4.22–5.81)
RDW: 14.2 % (ref 11.5–15.5)
WBC: 6.3 10*3/uL (ref 4.0–10.5)
nRBC: 0 % (ref 0.0–0.2)

## 2021-09-07 LAB — COMPREHENSIVE METABOLIC PANEL
ALT: 24 U/L (ref 0–44)
AST: 22 U/L (ref 15–41)
Albumin: 4.3 g/dL (ref 3.5–5.0)
Alkaline Phosphatase: 53 U/L (ref 38–126)
Anion gap: 7 (ref 5–15)
BUN: 13 mg/dL (ref 6–20)
CO2: 24 mmol/L (ref 22–32)
Calcium: 9.2 mg/dL (ref 8.9–10.3)
Chloride: 105 mmol/L (ref 98–111)
Creatinine, Ser: 0.79 mg/dL (ref 0.61–1.24)
GFR, Estimated: >60 mL/min (ref >60)
Glucose, Bld: 102 mg/dL — ABNORMAL HIGH (ref 70–99)
Potassium: 3.7 mmol/L (ref 3.5–5.1)
Sodium: 136 mmol/L (ref 135–145)
Total Bilirubin: 0.7 mg/dL (ref 0.3–1.2)
Total Protein: 7.5 g/dL (ref 6.5–8.1)

## 2021-09-07 LAB — TROPONIN I (HIGH SENSITIVITY): Troponin I (High Sensitivity): <2 ng/L (ref <18)

## 2021-09-07 NOTE — ED Notes (Signed)
Previous noted regarding elopement were made on wrong pt

## 2021-09-07 NOTE — ED Provider Triage Note (Signed)
Emergency Medicine Provider Triage Evaluation Note  Oscar Young , a 41 y.o. male  was evaluated in triage.  Pt complains of feeling tired and winded after picking up something heavy.  He reports this is new over the past few days.  He denies pain.  He reports intermittent chest pain.  He reports the pain is left sided, lasts about 1-2 minutes. This is random and not associated with any instigating events per patient.   He denies any meds.    Physical Exam  BP (!) 140/93 (BP Location: Right Arm)    Pulse 78    Temp 98 F (36.7 C) (Oral)    Resp 18    Ht 5\' 7"  (1.702 m)    Wt 85.3 kg    SpO2 100%    BMI 29.44 kg/m  Gen:   Awake, no distress   Resp:  Normal effort  MSK:   Moves extremities without difficulty  Other:  Normal speech   Medical Decision Making  Medically screening exam initiated at 5:43 PM.  Appropriate orders placed.  was informed that the remainder of the evaluation will be completed by another provider, this initial triage assessment does not replace that evaluation, and the importance of remaining in the ED until their evaluation is complete.     Karlyne Greenspan, Cristina Gong 09/07/21 [phone]

## 2021-09-07 NOTE — ED Provider Notes (Signed)
[facility] DEPT Provider Note   CSN: [id number] Arrival date & time: 09/07/21  1724     History  Chief Complaint  Patient presents with   Weakness    Oscar Young is a 41 y.o. male.  Patient indicates in past several days, when tried to pick up something heavy he feels generally tired/fatigued. No associated chest pain or discomfort. No sob. Rare, non prod cough, no sinus congestion, no fever or chills. No leg swelling, no orthopnea. Denies palpitations. No syncope. No abd pain or nvd. No recent blood loss, rectal bleeding or melena. No gu c/o.   The history is provided by the patient and medical records.      Home Medications Prior to Admission medications   Medication Sig Start Date End Date Taking? Authorizing Provider  verapamil (CALAN) 80 MG tablet Take 1 tablet (80 mg total) by mouth 3 (three) times daily as needed (Rapid heart beat). 06/27/21   Adrian Prows, MD      Allergies    Patient has no known allergies.    Review of Systems   Review of Systems  Constitutional:  Negative for chills and fever.  HENT:  Negative for sore throat.   Eyes:  Negative for redness.  Respiratory:  Negative for cough and shortness of breath.   Cardiovascular:  Negative for chest pain, palpitations and leg swelling.  Gastrointestinal:  Negative for abdominal pain, blood in stool, diarrhea and vomiting.  Genitourinary:  Negative for dysuria and flank pain.  Musculoskeletal:  Negative for back pain and neck pain.  Skin:  Negative for rash.  Neurological:  Negative for headaches.  Hematological:  Does not bruise/bleed easily.  Psychiatric/Behavioral:  Negative for confusion.    Physical Exam Updated Vital Signs BP 130/84 (BP Location: Right Arm)    Pulse 68    Temp 98.5 F (36.9 C) (Oral)    Resp 12    Ht 1.702 m (5\' 7" )    Wt 85.3 kg    SpO2 99%    BMI 29.44 kg/m  Physical Exam Vitals and nursing note reviewed.  Constitutional:      Appearance: Normal  appearance. He is well-developed.  HENT:     Head: Atraumatic.     Nose: Nose normal.     Mouth/Throat:     Mouth: Mucous membranes are moist.  Eyes:     General: No scleral icterus.    Conjunctiva/sclera: Conjunctivae normal.     Pupils: Pupils are equal, round, and reactive to light.  Neck:     Vascular: No carotid bruit.     Trachea: No tracheal deviation.  Cardiovascular:     Rate and Rhythm: Normal rate and regular rhythm.     Pulses: Normal pulses.     Heart sounds: Normal heart sounds. No murmur heard.   No friction rub. No gallop.  Pulmonary:     Effort: Pulmonary effort is normal. No accessory muscle usage or respiratory distress.     Breath sounds: Normal breath sounds.  Abdominal:     General: Bowel sounds are normal. There is no distension.     Palpations: Abdomen is soft.     Tenderness: There is no abdominal tenderness.  Genitourinary:    Comments: No cva tenderness. Musculoskeletal:        General: No swelling or tenderness.     Cervical back: Normal range of motion and neck supple. No rigidity.     Right lower leg: No edema.  Left lower leg: No edema.  Skin:    General: Skin is warm and dry.     Findings: No rash.  Neurological:     Mental Status: He is alert.     Comments: Alert, speech clear. Steady gait.   Psychiatric:        Mood and Affect: Mood normal.    ED Results / Procedures / Treatments   Labs (all labs ordered are listed, but only abnormal results are displayed) Results for orders placed or performed during the hospital encounter of 09/07/21  Comprehensive metabolic panel  Result Value Ref Range   Sodium 136 135 - 145 mmol/L   Potassium 3.7 3.5 - 5.1 mmol/L   Chloride 105 98 - 111 mmol/L   CO2 24 22 - 32 mmol/L   Glucose, Bld 102 (H) 70 - 99 mg/dL   BUN 13 6 - 20 mg/dL   Creatinine, Ser 0.79 0.61 - 1.24 mg/dL   Calcium 9.2 8.9 - 10.3 mg/dL   Total Protein 7.5 6.5 - 8.1 g/dL   Albumin 4.3 3.5 - 5.0 g/dL   AST 22 15 - 41 U/L    ALT 24 0 - 44 U/L   Alkaline Phosphatase 53 38 - 126 U/L   Total Bilirubin 0.7 0.3 - 1.2 mg/dL   GFR, Estimated >60 >60 mL/min   Anion gap 7 5 - 15  CBC with Differential  Result Value Ref Range   WBC 6.3 4.0 - 10.5 K/uL   RBC 5.59 4.22 - 5.81 MIL/uL   Hemoglobin 17.1 (H) 13.0 - 17.0 g/dL   HCT 51.3 39.0 - 52.0 %   MCV 91.8 80.0 - 100.0 fL   MCH 30.6 26.0 - 34.0 pg   MCHC 33.3 30.0 - 36.0 g/dL   RDW 14.2 11.5 - 15.5 %   Platelets 243 150 - 400 K/uL   nRBC 0.0 0.0 - 0.2 %   Neutrophils Relative % 69 %   Neutro Abs 4.4 1.7 - 7.7 K/uL   Lymphocytes Relative 24 %   Lymphs Abs 1.5 0.7 - 4.0 K/uL   Monocytes Relative 7 %   Monocytes Absolute 0.4 0.1 - 1.0 K/uL   Eosinophils Relative 0 %   Eosinophils Absolute 0.0 0.0 - 0.5 K/uL   Basophils Relative 0 %   Basophils Absolute 0.0 0.0 - 0.1 K/uL   Immature Granulocytes 0 %   Abs Immature Granulocytes 0.02 0.00 - 0.07 K/uL  Troponin I (High Sensitivity)  Result Value Ref Range   Troponin I (High Sensitivity) <2 <18 ng/L   DG Chest 2 View  Result Date: 09/07/2021 CLINICAL DATA:  Chest pain. EXAM: CHEST - 2 VIEW COMPARISON:  July 10, 2021. FINDINGS: The heart size and mediastinal contours are within normal limits. Both lungs are clear. The visualized skeletal structures are unremarkable. IMPRESSION: No active cardiopulmonary disease. Electronically Signed   By: Marijo Conception M.D.   On: 09/07/2021 18:13     ED ECG REPORT   Date: 09/07/2021  Rate: 76  Rhythm: normal sinus rhythm  QRS Axis: normal  Intervals: normal  ST/T Wave abnormalities: nonspecific ST changes  Conduction Disutrbances:none  Narrative Interpretation:   Old EKG Reviewed: unchanged  I have personally reviewed the EKG tracing    Radiology DG Chest 2 View  Result Date: 09/07/2021 CLINICAL DATA:  Chest pain. EXAM: CHEST - 2 VIEW COMPARISON:  July 10, 2021. FINDINGS: The heart size and mediastinal contours are within normal limits. Both lungs are clear.  The visualized skeletal structures are unremarkable. IMPRESSION: No active cardiopulmonary disease. Electronically Signed   By: Marijo Conception M.D.   On: 09/07/2021 18:13    Procedures Procedures    Medications Ordered in ED Medications - No data to display  ED Course/ Medical Decision Making/ A&P                           Medical Decision Making Problems Addressed: Other fatigue: acute illness or injury Weakness: acute illness or injury  Amount and/or Complexity of Data Reviewed External Data Reviewed: labs, radiology, ECG and notes. Labs: ordered. Decision-making details documented in ED Course. Radiology: ordered and independent interpretation performed. Decision-making details documented in ED Course. ECG/medicine tests: ordered. Decision-making details documented in ED Course.   Continuous pulse ox and cardiac monitoring. Stat labs. Ecg.   Reviewed nursing notes and prior charts for additional history. External reports reviewed, prior ecg reviewed.  Prior ed visit(s) for similar symptoms noted - workup then negative.   Cardiac monitor - sinus rhythm, rate 74.   Labs reviewed/interpreted by me - chem normal, trop normal.   CXR reviewed/interpreted by me - no pna.   Po fluids/food.   Pt currently appears stable for d/c.  Rec pcp f/u.  Return precautions provided.           Final Clinical Impression(s) / ED Diagnoses Final diagnoses:  Weakness  Other fatigue    Rx / DC Orders ED Discharge Orders     None         Lajean Saver, MD 09/07/21 2026

## 2021-09-07 NOTE — ED Notes (Signed)
MD Denton Lank made aware of pt attempt elopement, asked for PRN medications

## 2021-09-07 NOTE — ED Notes (Signed)
Pt NAD, a/ox4. Pt verbalizes understanding of all DC and f/u instructions. All questions answered. Pt walks with steady gait to lobby at DC.  ? ?

## 2021-09-07 NOTE — ED Triage Notes (Signed)
Patient reports he has been having heart issues but reports when he picks up something heavy he gets really tired. States he has felt heart palpitations as well. Denies pain in triage. Patient reports history of SVT .

## 2021-09-07 NOTE — ED Notes (Signed)
Pt found by security, walked back into hospital and back to hall C

## 2021-09-07 NOTE — ED Notes (Signed)
Pt NAD watching phone. A/ox4, states he has been feeling very weak when picking up objects that normally he can pick up. Pt states he feels winded after doing this. Pt denies other exertional weakness, CP, SOB,n/v. Pt states yesterday he thinks maybe he felt faint. LS clear, -orthopnea, edema noted.

## 2021-09-07 NOTE — Discharge Instructions (Addendum)
It was our pleasure to provide your ER care today - we hope that you feel better.  Drink plenty of fluids/stay well hydrated.   Follow up with primary care doctor in the coming week.   Return to ER if worse, new symptoms, fevers, chest pain, increased trouble breathing, new/severe pain, fainting, persistent fast heart beating, or other concern.

## 2021-09-09 DIAGNOSIS — R58 Hemorrhage, not elsewhere classified: Secondary | ICD-10-CM | POA: Diagnosis not present

## 2021-09-09 DIAGNOSIS — Z789 Other specified health status: Secondary | ICD-10-CM | POA: Diagnosis not present

## 2021-09-26 ENCOUNTER — Encounter: Payer: Self-pay | Admitting: Student

## 2021-09-26 ENCOUNTER — Ambulatory Visit: Payer: BC Managed Care – PPO | Admitting: Student

## 2021-09-26 ENCOUNTER — Other Ambulatory Visit: Payer: Self-pay

## 2021-09-26 VITALS — BP 133/93 | HR 89 | Temp 98.0°F | Resp 17 | Ht 67.0 in | Wt 195.0 lb

## 2021-09-26 DIAGNOSIS — R002 Palpitations: Secondary | ICD-10-CM | POA: Diagnosis not present

## 2021-09-26 MED ORDER — METOPROLOL SUCCINATE ER 25 MG PO TB24: 25.0000 mg | ORAL_TABLET | Freq: Every day | ORAL | 3 refills | Status: DC

## 2021-09-26 NOTE — Progress Notes (Signed)
Primary Physician/Referring:  Patient, No Pcp Per (Inactive)  Patient ID: Oscar Young, male    DOB: 08-18-1980, 41 y.o.   MRN: [id number]  Chief Complaint  Patient presents with   Dizziness   Follow-up   Palpitations    HPI:    Oscar Young  is a 41 y.o. African-American male patient with no significant prior cardiovascular history.  He has been evaluated in our office on multiple occasions since 2017 for palpitations.  Patient was last seen in our office 06/27/2021 at which time repeat cardiac monitor showed no significant cardiac arrhythmias, but rather PACs/PVCs.  Patient was advised to take verapamil as needed.  He was also advised to establish care with PCP, however he has not done so.  Patient now presents to our office for urgent visit as his request with complaints of more frequent and longer lasting episodes of palpitations over the last 2 days.  He states these are occurring several times a day and lasting 1 to 3 minutes.  Patient reports some episodes are associated with lightheadedness.  Denies chest pain, syncope, near syncope.  Patient does appear to have anxiety associated with these episodes.  He has not taken verapamil at all since it was prescribed.  He does use vagal maneuvers sometimes with success.  Past Medical History:  Diagnosis Date   SVT (supraventricular tachycardia) ([place])    History reviewed. No pertinent surgical history. Family History  Problem Relation Age of Onset   Diabetes Mother     Social History   Tobacco Use   Smoking status: Never   Smokeless tobacco: Not on file  Substance Use Topics   Alcohol use: Yes    Comment: OCC   Marital Status: Single  ROS  Review of Systems  Cardiovascular:  Positive for palpitations. Negative for chest pain, dyspnea on exertion, leg swelling, near-syncope and syncope.  Neurological:  Positive for light-headedness.  Objective  Blood pressure (!) 133/93, pulse 89, temperature 98 F (36.7 C), temperature source  Temporal, resp. rate 17, height 5\' 7"  (1.702 m), weight 195 lb (88.5 kg), SpO2 100 %. Body mass index is 30.54 kg/m.  Vitals with BMI 09/26/2021 09/07/2021 09/07/2021  Height 5\' 7"  - -  Weight 195 lbs - -  BMI [id number] - -  Systolic [id number]  Diastolic 93 75 87  Pulse 89 66 62    Orthostatic VS for the past 72 hrs (Last 3 readings):  Orthostatic BP Patient Position BP Location Cuff Size Orthostatic Pulse  09/26/21 1021 (!) 143/95 Standing Left Arm Normal 66  09/26/21 1020 (!) 143/97 Sitting Left Arm Normal 71  09/26/21 1019 (!) 144/97 Supine Left Arm Normal 68    Physical Exam Vitals reviewed.  Neck:     Vascular: No carotid bruit or JVD.  Cardiovascular:     Rate and Rhythm: Normal rate and regular rhythm.     Pulses: Intact distal pulses.     Heart sounds: Normal heart sounds. No murmur heard.   No gallop.  Pulmonary:     Effort: Pulmonary effort is normal.     Breath sounds: Normal breath sounds.  Musculoskeletal:     Right lower leg: No edema.     Left lower leg: No edema.     Laboratory examination:   Recent Labs    06/23/21 1900 07/09/21 2259 09/07/21 1940  NA 138 137 136  K 3.8 3.5 3.7  CL 104 105 105  CO2 28 25 24   GLUCOSE 113* 91 102*  BUN 10 14 13   CREATININE 0.93 0.90 0.79  CALCIUM 9.2 9.1 9.2  GFRNONAA >60 >60 >60  estimated creatinine clearance is 127.8 mL/min (by C-G formula based on SCr of 0.79 mg/dL).  CMP Latest Ref Rng & Units 09/07/2021 07/09/2021 06/23/2021  Glucose 70 - 99 mg/dL 102(H) 91 113(H)  BUN 6 - 20 mg/dL 13 14 10   Creatinine 0.61 - 1.24 mg/dL 0.79 0.90 0.93  Sodium 135 - 145 mmol/L 136 137 138  Potassium 3.5 - 5.1 mmol/L 3.7 3.5 3.8  Chloride 98 - 111 mmol/L 105 105 104  CO2 22 - 32 mmol/L 24 25 28   Calcium 8.9 - 10.3 mg/dL 9.2 9.1 9.2  Total Protein 6.5 - 8.1 g/dL 7.5 7.8 -  Total Bilirubin 0.3 - 1.2 mg/dL 0.7 0.5 -  Alkaline Phos 38 - 126 U/L 53 51 -  AST 15 - 41 U/L 22 27 -  ALT 0 - 44 U/L 24 33 -   CBC Latest Ref Rng &  Units 09/07/2021 07/09/2021 06/23/2021  WBC 4.0 - 10.5 K/uL 6.3 6.5 4.2  Hemoglobin 13.0 - 17.0 g/dL 17.1(H) 16.1 16.3  Hematocrit 39.0 - 52.0 % 51.3 49.0 50.2  Platelets 150 - 400 K/uL 243 260 222   Lipid Panel No results for input(s): CHOL, TRIG, LDLCALC, VLDL, HDL, CHOLHDL, LDLDIRECT in the last 8760 hours.  HEMOGLOBIN A1C No results found for: HGBA1C, MPG  TSH Recent Labs    07/10/21 0900  TSH 3.181    External labs:   NA Medications and allergies  No Known Allergies   Medication prior to this encounter:   Outpatient Medications Prior to Visit  Medication Sig Dispense Refill   Omega-3 Fatty Acids (FISH OIL) 1000 MG CAPS Take 1 capsule by mouth daily.     escitalopram (LEXAPRO) 10 MG tablet Take 10 mg by mouth daily. (Patient not taking: Reported on 09/26/2021)     verapamil (CALAN) 80 MG tablet Take 1 tablet (80 mg total) by mouth 3 (three) times daily as needed (Rapid heart beat). (Patient not taking: Reported on 09/26/2021) 60 tablet 0   No facility-administered medications prior to visit.     Medication list after today's encounter   Current Outpatient Medications  Medication Instructions   escitalopram (LEXAPRO) 10 mg, Daily   metoprolol succinate (TOPROL-XL) 25 mg, Oral, Daily, Take with or immediately following a meal.   Omega-3 Fatty Acids (FISH OIL) 1000 MG CAPS 1 capsule, Oral, Daily   verapamil (CALAN) 80 mg, Oral, 3 times daily PRN    Radiology:   No results found.  Cardiac Studies:   Event monitor 30 days 10/08/40:  Sinus rhythm, sinus tachycardia, maximum heart rate 1 62 bpm.  No other arrhythmias.  Patient's symptoms of dizziness, lightheadedness and fatigue revealed normal sinus rhythm. No significant change from 06/24/2021.  EKG:   09/26/2021: Sinus rhythm at a rate of 66 bpm.  Normal axis.  No evidence of ischemia or underlying injury pattern.  Compared to EKG 06/27/2021, no significant change.  Assessment     ICD-10-CM   1. Palpitation   R00.2 EKG 12-Lead       There are no discontinued medications.   Meds ordered this encounter  Medications   metoprolol succinate (TOPROL-XL) 25 MG 24 hr tablet    Sig: Take 1 tablet (25 mg total) by mouth daily. Take with or immediately following a meal.    Dispense:  30 tablet    Refill:  3    Orders Placed  This Encounter  Procedures   EKG 12-Lead   Recommendations:   Oscar Young is a 40 y.o. African-American male patient with no significant prior cardiovascular history.  He has been evaluated in our office on multiple occasions since 2017 for palpitations.  Patient was last seen in our office 06/27/2021 at which time repeat cardiac monitor showed no significant cardiac arrhythmias, but rather PACs/PVCs.  Patient has not taken previously prescribed verapamil at all.  Given frequent symptoms as well as lightheadedness associated with palpitations discussed with patient option of daily AV nodal blocking agents.  Patient was previously on metoprolol which she tolerated well and shared decision was to proceed with resuming this.  We will start metoprolol succinate 25 mg p.o. once daily.  This should help with both patient's palpitations as well as his mildly elevated blood pressure.   Again reiterated to patient the importance of establishing with PCP.  Follow-up in 3 months, sooner if needed, for palpitations.   Alethia Berthold, PA-C 09/26/2021, 11:38 AM Office: [phone]

## 2021-12-24 ENCOUNTER — Encounter: Payer: Self-pay | Admitting: Student

## 2021-12-24 ENCOUNTER — Ambulatory Visit: Payer: BC Managed Care – PPO | Admitting: Student

## 2021-12-24 VITALS — BP 122/81 | HR 69 | Temp 98.0°F | Resp 17 | Ht 68.0 in | Wt 199.0 lb

## 2021-12-24 DIAGNOSIS — R002 Palpitations: Secondary | ICD-10-CM

## 2021-12-24 MED ORDER — METOPROLOL SUCCINATE ER 25 MG PO TB24: 25.0000 mg | ORAL_TABLET | Freq: Every day | ORAL | 3 refills | Status: DC

## 2021-12-24 NOTE — Progress Notes (Signed)
? ?Primary Physician/Referring:  Patient, No Pcp Per (Inactive) ? ?Patient ID: Oscar Young, male    DOB: October 08, 1980, 41 y.o.   MRN: [id number] ? ?Chief Complaint  ?Patient presents with  ? Palpitations  ? Follow-up  ?  3 months  ? ? ?HPI:   ? ?Oscar Young  is a 41 y.o. African-American male patient with no significant prior cardiovascular history.  He has been evaluated in our office on multiple occasions since 2017 for palpitations. ? ?Patient was last seen in our office 09/26/2021 with complaints of palpitations and known PACs and PVCs on cardiac monitor, however at that time he had not taken previously prescribed verapamil.  He was previously on metoprolol which she tolerated well, therefore shared decision was to resume metoprolol succinate 25 mg p.o. once daily for both palpitations and elevated blood pressure.  He now presents for 67-month follow-up.  Patient reports palpitations have essentially resolved.  He is tolerating addition of metoprolol without issue.  Blood pressure is well controlled. ? ?Past Medical History:  ?Diagnosis Date  ? SVT (supraventricular tachycardia) ([place])   ? ?History reviewed. No pertinent surgical history. ?Family History  ?Problem Relation Age of Onset  ? Diabetes Mother   ?  ?Social History  ? ?Tobacco Use  ? Smoking status: Never  ? Smokeless tobacco: Not on file  ?Substance Use Topics  ? Alcohol use: Yes  ?  Comment: OCC  ? ?Marital Status: Single  ?ROS  ?Review of Systems  ?Cardiovascular:  Negative for chest pain, dyspnea on exertion, leg swelling, near-syncope, palpitations (resolved) and syncope.  ?Neurological:  Positive for light-headedness (improved, occasional).  ?Objective  ?Blood pressure 122/81, pulse 69, temperature 98 ?F (36.7 ?C), temperature source Temporal, resp. rate 17, height 5\' 8"  (1.727 m), weight 199 lb (90.3 kg), SpO2 97 %. Body mass index is 30.26 kg/m?.  ? ?  12/24/2021  ?  9:59 AM 09/26/2021  ? 10:14 AM 09/07/2021  ?  9:09 PM  ?Vitals with BMI  ?Height 5\' 8"  5\' 7"     ?Weight 199 lbs 195 lbs   ?BMI 30.26 30.53   ?Systolic [id number]  ?Diastolic 81 93 75  ?Pulse 69 89 66  ?  ?Orthostatic VS for the past 72 hrs (Last 3 readings): ? Patient Position BP Location Cuff Size  ?12/24/21 0959 Sitting Left Arm Normal  ? ? ?Physical Exam ?Vitals reviewed.  ?Neck:  ?   Vascular: No carotid bruit or JVD.  ?Cardiovascular:  ?   Rate and Rhythm: Normal rate and regular rhythm.  ?   Pulses: Intact distal pulses.  ?   Heart sounds: Normal heart sounds. No murmur heard. ?  No gallop.  ?Pulmonary:  ?   Effort: Pulmonary effort is normal.  ?   Breath sounds: Normal breath sounds.  ?Musculoskeletal:  ?   Right lower leg: No edema.  ?   Left lower leg: No edema.  ?Physical exam unchanged compared to previous office visit. ? ?Laboratory examination:  ? ?Recent Labs  ?  06/23/21 ?1900 07/09/21 ?2259 09/07/21 ?1940  ?NA 138 137 136  ?K 3.8 3.5 3.7  ?CL 104 105 105  ?CO2 28 25 24   ?GLUCOSE 113* 91 102*  ?BUN 10 14 13   ?CREATININE 0.93 0.90 0.79  ?CALCIUM 9.2 9.1 9.2  ?GFRNONAA >60 >60 >60  ?CrCl cannot be calculated (Patient's most recent lab result is older than the maximum 21 days allowed.).  ? ?  Latest Ref Rng & Units 09/07/2021  ?  7:40 PM 07/09/2021  ? 10:59 PM 06/23/2021  ?  7:00 PM  ?CMP  ?Glucose 70 - 99 mg/dL 102   91   113    ?BUN 6 - 20 mg/dL 13   14   10     ?Creatinine 0.61 - 1.24 mg/dL 0.79   0.90   0.93    ?Sodium 135 - 145 mmol/L 136   137   138    ?Potassium 3.5 - 5.1 mmol/L 3.7   3.5   3.8    ?Chloride 98 - 111 mmol/L 105   105   104    ?CO2 22 - 32 mmol/L 24   25   28     ?Calcium 8.9 - 10.3 mg/dL 9.2   9.1   9.2    ?Total Protein 6.5 - 8.1 g/dL 7.5   7.8     ?Total Bilirubin 0.3 - 1.2 mg/dL 0.7   0.5     ?Alkaline Phos 38 - 126 U/L 53   51     ?AST 15 - 41 U/L 22   27     ?ALT 0 - 44 U/L 24   33     ? ? ?  Latest Ref Rng & Units 09/07/2021  ?  7:40 PM 07/09/2021  ? 10:59 PM 06/23/2021  ?  7:00 PM  ?CBC  ?WBC 4.0 - 10.5 K/uL 6.3   6.5   4.2    ?Hemoglobin 13.0 - 17.0 g/dL 17.1   16.1    16.3    ?Hematocrit 39.0 - 52.0 % 51.3   49.0   50.2    ?Platelets 150 - 400 K/uL 243   260   222    ? ?Lipid Panel ?No results for input(s): CHOL, TRIG, LDLCALC, VLDL, HDL, CHOLHDL, LDLDIRECT in the last 8760 hours. ? ?HEMOGLOBIN A1C ?No results found for: HGBA1C, MPG ? ?TSH ?Recent Labs  ?  07/10/21 ?0900  ?TSH 3.181  ? ?External labs:  ?NA ? ?Medications and allergies  ?No Known Allergies  ? ?Medication prior to this encounter:  ? ?Outpatient Medications Prior to Visit  ?Medication Sig Dispense Refill  ? metoprolol succinate (TOPROL-XL) 25 MG 24 hr tablet Take 1 tablet (25 mg total) by mouth daily. Take with or immediately following a meal. 30 tablet 3  ? Omega-3 Fatty Acids (FISH OIL) 1000 MG CAPS Take 1 capsule by mouth daily.    ? escitalopram (LEXAPRO) 10 MG tablet Take 10 mg by mouth daily. (Patient not taking: Reported on 09/26/2021)    ? verapamil (CALAN) 80 MG tablet Take 1 tablet (80 mg total) by mouth 3 (three) times daily as needed (Rapid heart beat). (Patient not taking: Reported on 09/26/2021) 60 tablet 0  ? ?No facility-administered medications prior to visit.  ?  ?Medication list after today's encounter  ? ?Current Outpatient Medications  ?Medication Instructions  ? metoprolol succinate (TOPROL-XL) 25 mg, Oral, Daily, Take with or immediately following a meal.  ? ?Radiology:  ? ?No results found. ? ?Cardiac Studies:  ? ?Event monitor 30 days 10/03/2015: ? Sinus rhythm, sinus tachycardia, maximum heart rate 1 62 bpm.  No other arrhythmias.  Patient's symptoms of dizziness, lightheadedness and fatigue revealed normal sinus rhythm. No significant change from 06/24/2021. ? ?EKG:  ? ?09/26/2021: Sinus rhythm at a rate of 66 bpm.  Normal axis.  No evidence of ischemia or underlying injury pattern.  Compared to EKG 06/27/2021, no significant change. ? ?Assessment  ? ?  ICD-10-CM   ?  1. Palpitation  R00.2   ?  ?  ? ?Medications Discontinued During This Encounter  ?Medication Reason  ? escitalopram (LEXAPRO) 10  MG tablet   ? Omega-3 Fatty Acids (FISH OIL) 1000 MG CAPS   ? verapamil (CALAN) 80 MG tablet   ? metoprolol succinate (TOPROL-XL) 25 MG 24 hr tablet Reorder  ? ?  ?Meds ordered this encounter  ?Medications  ? metoprolol succinate (TOPROL-XL) 25 MG 24 hr tablet  ?  Sig: Take 1 tablet (25 mg total) by mouth daily. Take with or immediately following a meal.  ?  Dispense:  90 tablet  ?  Refill:  3  ? ? ?No orders of the defined types were placed in this encounter. ? ?Recommendations:  ? ?Oscar Young is a 41 y.o. African-American male patient with no significant prior cardiovascular history.  He has been evaluated in our office on multiple occasions since 2017 for palpitations. ? ?Patient was last seen in our office 09/26/2021 with complaints of palpitations and known PACs and PVCs on cardiac monitor, however at that time he had not taken previously prescribed verapamil.  He was previously on metoprolol which she tolerated well, therefore shared decision was to resume metoprolol succinate 25 mg p.o. once daily for both palpitations and elevated blood pressure.  He now presents for 64-month follow-up.  Patient's blood pressure is now well controlled and palpitations have essentially resolved.  We will continue metoprolol succinate.  Counseled patient regarding liberal hydration in regards to lightheadedness. ? ?Encourage patient to establish with PCP.  Follow-up in 1 year, sooner if needed. ? ? ?Alethia Berthold, PA-C ?12/24/2021, 10:26 AM ?Office: [phone] ?

## 2022-01-20 ENCOUNTER — Other Ambulatory Visit: Payer: Self-pay | Admitting: Student

## 2022-06-27 ENCOUNTER — Ambulatory Visit: Payer: BC Managed Care – PPO | Admitting: Cardiology

## 2022-09-05 ENCOUNTER — Inpatient Hospital Stay (HOSPITAL_COMMUNITY)
Admission: EM | Admit: 2022-09-05 | Discharge: 2022-09-08 | DRG: 872 | Disposition: A | Discharge status: Home / Self Care | Payer: BC Managed Care – PPO | Attending: Internal Medicine | Admitting: Internal Medicine

## 2022-09-05 ENCOUNTER — Emergency Department (HOSPITAL_COMMUNITY): Payer: BC Managed Care – PPO

## 2022-09-05 ENCOUNTER — Ambulatory Visit (HOSPITAL_COMMUNITY)
Admission: EM | Admit: 2022-09-05 | Discharge: 2022-09-05 | Disposition: A | Discharge status: Home / Self Care | Payer: BC Managed Care – PPO

## 2022-09-05 ENCOUNTER — Encounter (HOSPITAL_COMMUNITY): Payer: Self-pay

## 2022-09-05 ENCOUNTER — Other Ambulatory Visit: Payer: Self-pay

## 2022-09-05 DIAGNOSIS — E876 Hypokalemia: Secondary | ICD-10-CM | POA: Diagnosis not present

## 2022-09-05 DIAGNOSIS — R6520 Severe sepsis without septic shock: Secondary | ICD-10-CM | POA: Diagnosis present

## 2022-09-05 DIAGNOSIS — R000 Tachycardia, unspecified: Secondary | ICD-10-CM | POA: Diagnosis not present

## 2022-09-05 DIAGNOSIS — E872 Acidosis, unspecified: Secondary | ICD-10-CM | POA: Diagnosis not present

## 2022-09-05 DIAGNOSIS — N200 Calculus of kidney: Secondary | ICD-10-CM | POA: Diagnosis not present

## 2022-09-05 DIAGNOSIS — Z833 Family history of diabetes mellitus: Secondary | ICD-10-CM

## 2022-09-05 DIAGNOSIS — A419 Sepsis, unspecified organism: Secondary | ICD-10-CM | POA: Diagnosis not present

## 2022-09-05 DIAGNOSIS — Z79899 Other long term (current) drug therapy: Secondary | ICD-10-CM | POA: Diagnosis not present

## 2022-09-05 DIAGNOSIS — K760 Fatty (change of) liver, not elsewhere classified: Secondary | ICD-10-CM | POA: Diagnosis not present

## 2022-09-05 DIAGNOSIS — E669 Obesity, unspecified: Secondary | ICD-10-CM | POA: Diagnosis present

## 2022-09-05 DIAGNOSIS — K5732 Diverticulitis of large intestine without perforation or abscess without bleeding: Secondary | ICD-10-CM | POA: Diagnosis not present

## 2022-09-05 DIAGNOSIS — K5730 Diverticulosis of large intestine without perforation or abscess without bleeding: Secondary | ICD-10-CM | POA: Diagnosis not present

## 2022-09-05 DIAGNOSIS — K5792 Diverticulitis of intestine, part unspecified, without perforation or abscess without bleeding: Principal | ICD-10-CM

## 2022-09-05 DIAGNOSIS — R1031 Right lower quadrant pain: Secondary | ICD-10-CM | POA: Diagnosis not present

## 2022-09-05 DIAGNOSIS — I471 Supraventricular tachycardia, unspecified: Secondary | ICD-10-CM | POA: Diagnosis present

## 2022-09-05 DIAGNOSIS — Z6830 Body mass index (BMI) 30.0-30.9, adult: Secondary | ICD-10-CM | POA: Diagnosis not present

## 2022-09-05 LAB — CBC WITH DIFFERENTIAL/PLATELET
Abs Immature Granulocytes: 0.05 10*3/uL (ref 0.00–0.07)
Basophils Absolute: 0 10*3/uL (ref 0.0–0.1)
Basophils Relative: 0 %
Eosinophils Absolute: 0 10*3/uL (ref 0.0–0.5)
Eosinophils Relative: 0 %
HCT: 49.6 % (ref 39.0–52.0)
Hemoglobin: 16.7 g/dL (ref 13.0–17.0)
Immature Granulocytes: 0 %
Lymphocytes Relative: 10 %
Lymphs Abs: 1.5 10*3/uL (ref 0.7–4.0)
MCH: 31.1 pg (ref 26.0–34.0)
MCHC: 33.7 g/dL (ref 30.0–36.0)
MCV: 92.4 fL (ref 80.0–100.0)
Monocytes Absolute: 1 10*3/uL (ref 0.1–1.0)
Monocytes Relative: 7 %
Neutro Abs: 11.6 10*3/uL — ABNORMAL HIGH (ref 1.7–7.7)
Neutrophils Relative %: 83 %
Platelets: 207 10*3/uL (ref 150–400)
RBC: 5.37 MIL/uL (ref 4.22–5.81)
RDW: 14.2 % (ref 11.5–15.5)
WBC: 14.1 10*3/uL — ABNORMAL HIGH (ref 4.0–10.5)
nRBC: 0 % (ref 0.0–0.2)

## 2022-09-05 LAB — URINALYSIS, W/ REFLEX TO CULTURE (INFECTION SUSPECTED)
Bacteria, UA: NONE SEEN
Bilirubin Urine: NEGATIVE
Glucose, UA: NEGATIVE mg/dL
Ketones, ur: 5 mg/dL — AB
Leukocytes,Ua: NEGATIVE
Nitrite: NEGATIVE
Protein, ur: NEGATIVE mg/dL
Specific Gravity, Urine: 1.012 (ref 1.005–1.030)
pH: 6 (ref 5.0–8.0)

## 2022-09-05 LAB — COMPREHENSIVE METABOLIC PANEL
ALT: 38 U/L (ref 0–44)
AST: 29 U/L (ref 15–41)
Albumin: 4.1 g/dL (ref 3.5–5.0)
Alkaline Phosphatase: 48 U/L (ref 38–126)
Anion gap: 11 (ref 5–15)
BUN: 7 mg/dL (ref 6–20)
CO2: 24 mmol/L (ref 22–32)
Calcium: 9.5 mg/dL (ref 8.9–10.3)
Chloride: 103 mmol/L (ref 98–111)
Creatinine, Ser: 1.14 mg/dL (ref 0.61–1.24)
GFR, Estimated: >60 mL/min (ref >60)
Glucose, Bld: 98 mg/dL (ref 70–99)
Potassium: 3.4 mmol/L — ABNORMAL LOW (ref 3.5–5.1)
Sodium: 138 mmol/L (ref 135–145)
Total Bilirubin: 1.2 mg/dL (ref 0.3–1.2)
Total Protein: 7.8 g/dL (ref 6.5–8.1)

## 2022-09-05 LAB — PROTIME-INR
INR: 1.1 (ref 0.8–1.2)
Prothrombin Time: 14.3 seconds (ref 11.4–15.2)

## 2022-09-05 LAB — LACTIC ACID, PLASMA: Lactic Acid, Venous: 2 mmol/L (ref 0.5–1.9)

## 2022-09-05 LAB — LIPASE, BLOOD: Lipase: 38 U/L (ref 11–51)

## 2022-09-05 LAB — APTT: aPTT: 28 seconds (ref 24–36)

## 2022-09-05 MED ORDER — IOHEXOL 350 MG/ML SOLN
75.0000 mL | Freq: Once | INTRAVENOUS | Status: AC | PRN
  Administered 2022-09-05: 75 mL via INTRAVENOUS

## 2022-09-05 NOTE — ED Triage Notes (Signed)
Patient c/o mid and right lower abdominal pain since last night. Patient denies any n/v/D  Patient denies taking any medication for his symptoms.

## 2022-09-05 NOTE — ED Provider Triage Note (Signed)
Emergency Medicine Provider Triage Evaluation Note  Ladislaus Repsher , a 42 y.o. male  was evaluated in triage.  Pt complains of abdominal pain.  Patient reports abdominal pain beginning last night prior to going to bed.  States that he has been unable to eat anything today secondary to nauseous feeling.  Denies emesis, history of similar symptoms.  Was seen in urgent care earlier today and found to be tachycardic, febrile with right lower quadrant abdominal pain and sent to emergency department for further evaluation.  Denies chest pain, shortness of breath..  Review of Systems  Positive: See above Negative:   Physical Exam  BP (!) 124/96 (BP Location: Left Arm)   Pulse (!) 111   Temp (!) 100.7 F (38.2 C)   Resp 15   SpO2 100%  Gen:   Awake, no distress   Resp:  Normal effort  MSK:   Moves extremities without difficulty  Other:  Right lower quadrant abdominal tenderness  Medical Decision Making  Medically screening exam initiated at 8:30 PM.  Appropriate orders placed.  [name] was informed that the remainder of the evaluation will be completed by another provider, this initial triage assessment does not replace that evaluation, and the importance of remaining in the ED until their evaluation is complete.     Wilnette Kales, [place] 09/05/22 2035

## 2022-09-05 NOTE — ED Provider Notes (Signed)
[facility]    CSN: [id number] Arrival date & time: 09/05/22  1557      History   Chief Complaint Chief Complaint  Patient presents with   Abdominal Pain   Fever    HPI Oscar Young is a 42 y.o. male.    Abdominal Pain Associated symptoms: constipation and fever   Associated symptoms: no chills, no nausea, no shortness of breath and no vomiting   Fever Associated symptoms: no chills, no nausea and no vomiting     Past Medical History:  Diagnosis Date   SVT (supraventricular tachycardia)     There are no problems to display for this patient.   History reviewed. No pertinent surgical history.     Home Medications    Prior to Admission medications   Medication Sig Start Date End Date Taking? Authorizing Provider  metoprolol succinate (TOPROL-XL) 25 MG 24 hr tablet TAKE 1 TABLET(25 MG) BY MOUTH DAILY WITH OR IMMEDIATELY FOLLOWING A MEAL 01/20/22   Cantwell, Gerline Legacy, PA-C    Family History Family History  Problem Relation Age of Onset   Diabetes Mother     Social History Social History   Tobacco Use   Smoking status: Never  Vaping Use   Vaping Use: Never used  Substance Use Topics   Alcohol use: Yes    Comment: OCC   Drug use: No     Allergies   Patient has no known allergies.   Review of Systems Review of Systems  Constitutional:  Positive for fever. Negative for chills.  Eyes:  Negative for discharge and redness.  Respiratory:  Negative for shortness of breath.   Gastrointestinal:  Positive for abdominal pain and constipation. Negative for nausea and vomiting.  Neurological:  Negative for numbness.     Physical Exam Triage Vital Signs ED Triage Vitals  Enc Vitals Group     BP 09/05/22 1835 132/87     Pulse Rate 09/05/22 1835 (!) 108     Resp 09/05/22 1835 16     Temp 09/05/22 1835 (!) 100.8 F (38.2 C)     Temp Source 09/05/22 1835 Oral     SpO2 09/05/22 1835 97 %     Weight --      Height --      Head Circumference  --      Peak Flow --      Pain Score 09/05/22 1837 2     Pain Loc --      Pain Edu? --      Excl. in [place]? --    No data found.  Updated Vital Signs BP 132/87 (BP Location: Left Arm)   Pulse (!) 108   Temp (!) 100.8 F (38.2 C) (Oral)   Resp 16   SpO2 97%    Physical Exam Vitals and nursing note reviewed.  Constitutional:      Appearance: Normal appearance.     Comments: Appears uncomfortable  HENT:     Head: Normocephalic and atraumatic.  Eyes:     Conjunctiva/sclera: Conjunctivae normal.  Cardiovascular:     Rate and Rhythm: Regular rhythm. Tachycardia present.  Pulmonary:     Effort: Pulmonary effort is normal. No respiratory distress.     Breath sounds: Normal breath sounds.  Abdominal:     General: Abdomen is flat. Bowel sounds are normal. There is distension (mild).     Tenderness: There is abdominal tenderness (significant RLQ TTP). There is guarding.  Neurological:     Mental Status:  He is alert.  Psychiatric:        Mood and Affect: Mood normal.        Behavior: Behavior normal.        Thought Content: Thought content normal.      UC Treatments / Results  Labs (all labs ordered are listed, but only abnormal results are displayed) Labs Reviewed - No data to display  EKG   Radiology No results found.  Procedures Procedures (including critical care time)  Medications Ordered in UC Medications - No data to display  Initial Impression / Assessment and Plan / UC Course  I have reviewed the triage vital signs and the nursing notes.  Pertinent labs & imaging results that were available during my care of the patient were reviewed by me and considered in my medical decision making (see chart for details).    Significant concern for appendicitis. Advised he report to ED immediately. He feels he can transport himself next door via POV.   Final Clinical Impressions(s) / UC Diagnoses   Final diagnoses:  RLQ abdominal pain   Discharge Instructions    None    ED Prescriptions   None    PDMP not reviewed this encounter.   Francene Finders, PA-C 09/05/22 [phone]

## 2022-09-05 NOTE — ED Triage Notes (Signed)
Pt with RLQ pain since last night. Sent from [facility] for eval of appendicitis.  No n/v/d.  Described as coming and going and sharp in nature.  Different pain with position.

## 2022-09-06 DIAGNOSIS — K5732 Diverticulitis of large intestine without perforation or abscess without bleeding: Secondary | ICD-10-CM | POA: Diagnosis present

## 2022-09-06 DIAGNOSIS — Z833 Family history of diabetes mellitus: Secondary | ICD-10-CM | POA: Diagnosis not present

## 2022-09-06 DIAGNOSIS — K5792 Diverticulitis of intestine, part unspecified, without perforation or abscess without bleeding: Secondary | ICD-10-CM | POA: Diagnosis present

## 2022-09-06 DIAGNOSIS — E876 Hypokalemia: Secondary | ICD-10-CM | POA: Diagnosis present

## 2022-09-06 DIAGNOSIS — E669 Obesity, unspecified: Secondary | ICD-10-CM | POA: Diagnosis present

## 2022-09-06 DIAGNOSIS — Z6830 Body mass index (BMI) 30.0-30.9, adult: Secondary | ICD-10-CM | POA: Diagnosis not present

## 2022-09-06 DIAGNOSIS — Z79899 Other long term (current) drug therapy: Secondary | ICD-10-CM | POA: Diagnosis not present

## 2022-09-06 DIAGNOSIS — I471 Supraventricular tachycardia, unspecified: Secondary | ICD-10-CM | POA: Diagnosis present

## 2022-09-06 DIAGNOSIS — E872 Acidosis, unspecified: Secondary | ICD-10-CM | POA: Diagnosis present

## 2022-09-06 DIAGNOSIS — A419 Sepsis, unspecified organism: Secondary | ICD-10-CM | POA: Diagnosis present

## 2022-09-06 LAB — COMPREHENSIVE METABOLIC PANEL
ALT: 31 U/L (ref 0–44)
AST: 25 U/L (ref 15–41)
Albumin: 3.5 g/dL (ref 3.5–5.0)
Alkaline Phosphatase: 39 U/L (ref 38–126)
Anion gap: 9 (ref 5–15)
BUN: 7 mg/dL (ref 6–20)
CO2: 25 mmol/L (ref 22–32)
Calcium: 9 mg/dL (ref 8.9–10.3)
Chloride: 105 mmol/L (ref 98–111)
Creatinine, Ser: 1.11 mg/dL (ref 0.61–1.24)
GFR, Estimated: >60 mL/min (ref >60)
Glucose, Bld: 101 mg/dL — ABNORMAL HIGH (ref 70–99)
Potassium: 3.8 mmol/L (ref 3.5–5.1)
Sodium: 139 mmol/L (ref 135–145)
Total Bilirubin: 1.3 mg/dL — ABNORMAL HIGH (ref 0.3–1.2)
Total Protein: 7 g/dL (ref 6.5–8.1)

## 2022-09-06 LAB — CBC
HCT: 47.1 % (ref 39.0–52.0)
Hemoglobin: 15.3 g/dL (ref 13.0–17.0)
MCH: 30.8 pg (ref 26.0–34.0)
MCHC: 32.5 g/dL (ref 30.0–36.0)
MCV: 94.8 fL (ref 80.0–100.0)
Platelets: 183 10*3/uL (ref 150–400)
RBC: 4.97 MIL/uL (ref 4.22–5.81)
RDW: 14.4 % (ref 11.5–15.5)
WBC: 11.4 10*3/uL — ABNORMAL HIGH (ref 4.0–10.5)
nRBC: 0 % (ref 0.0–0.2)

## 2022-09-06 LAB — CREATININE, SERUM
Creatinine, Ser: 1.18 mg/dL (ref 0.61–1.24)
GFR, Estimated: >60 mL/min (ref >60)

## 2022-09-06 LAB — LACTIC ACID, PLASMA
Lactic Acid, Venous: 2 mmol/L (ref 0.5–1.9)
Lactic Acid, Venous: 1.2 mmol/L (ref 0.5–1.9)
Lactic Acid, Venous: 1.3 mmol/L (ref 0.5–1.9)

## 2022-09-06 LAB — MAGNESIUM: Magnesium: 1.9 mg/dL (ref 1.7–2.4)

## 2022-09-06 LAB — HIV ANTIBODY (ROUTINE TESTING W REFLEX): HIV Screen 4th Generation wRfx: NONREACTIVE

## 2022-09-06 LAB — PHOSPHORUS: Phosphorus: 3.8 mg/dL (ref 2.5–4.6)

## 2022-09-06 MED ORDER — METOPROLOL SUCCINATE ER 25 MG PO TB24
12.5000 mg | ORAL_TABLET | Freq: Every day | ORAL | Status: DC
  Administered 2022-09-06 – 2022-09-08 (×3): 12.5 mg via ORAL
  Filled 2022-09-06 (×3): qty 1

## 2022-09-06 MED ORDER — ENOXAPARIN SODIUM 40 MG/0.4ML IJ SOSY
40.0000 mg | PREFILLED_SYRINGE | INTRAMUSCULAR | Status: DC
  Administered 2022-09-06 – 2022-09-07 (×2): 40 mg via SUBCUTANEOUS
  Filled 2022-09-06 (×3): qty 0.4

## 2022-09-06 MED ORDER — OXYCODONE HCL 5 MG PO TABS: 5.0000 mg | ORAL_TABLET | Freq: Four times a day (QID) | ORAL | Status: DC | PRN

## 2022-09-06 MED ORDER — ONDANSETRON HCL 4 MG/2ML IJ SOLN: 4.0000 mg | Freq: Four times a day (QID) | INTRAMUSCULAR | Status: DC | PRN

## 2022-09-06 MED ORDER — POTASSIUM CHLORIDE CRYS ER 20 MEQ PO TBCR
40.0000 meq | EXTENDED_RELEASE_TABLET | Freq: Once | ORAL | Status: AC
  Administered 2022-09-06: 40 meq via ORAL
  Filled 2022-09-06: qty 2

## 2022-09-06 MED ORDER — CIPROFLOXACIN IN D5W 400 MG/200ML IV SOLN
400.0000 mg | Freq: Two times a day (BID) | INTRAVENOUS | Status: DC
  Administered 2022-09-06 – 2022-09-08 (×5): 400 mg via INTRAVENOUS
  Filled 2022-09-06 (×6): qty 200

## 2022-09-06 MED ORDER — METRONIDAZOLE 500 MG/100ML IV SOLN
500.0000 mg | Freq: Once | INTRAVENOUS | Status: AC
  Administered 2022-09-06: 500 mg via INTRAVENOUS
  Filled 2022-09-06: qty 100

## 2022-09-06 MED ORDER — CIPROFLOXACIN IN D5W 400 MG/200ML IV SOLN
400.0000 mg | Freq: Once | INTRAVENOUS | Status: AC
  Administered 2022-09-06: 400 mg via INTRAVENOUS
  Filled 2022-09-06: qty 200

## 2022-09-06 MED ORDER — ACETAMINOPHEN 500 MG PO TABS
1000.0000 mg | ORAL_TABLET | Freq: Once | ORAL | Status: AC
  Administered 2022-09-06: 1000 mg via ORAL
  Filled 2022-09-06: qty 2

## 2022-09-06 MED ORDER — MORPHINE SULFATE (PF) 4 MG/ML IV SOLN
4.0000 mg | Freq: Once | INTRAVENOUS | Status: AC
  Administered 2022-09-06: 4 mg via INTRAVENOUS
  Filled 2022-09-06: qty 1

## 2022-09-06 MED ORDER — HYDROMORPHONE HCL 1 MG/ML IJ SOLN: 0.5000 mg | INTRAMUSCULAR | Status: DC | PRN

## 2022-09-06 MED ORDER — LACTATED RINGERS IV BOLUS
1000.0000 mL | Freq: Once | INTRAVENOUS | Status: AC
  Administered 2022-09-06: 1000 mL via INTRAVENOUS

## 2022-09-06 MED ORDER — LACTATED RINGERS IV SOLN: INTRAVENOUS | Status: DC

## 2022-09-06 MED ORDER — METRONIDAZOLE 500 MG/100ML IV SOLN
500.0000 mg | Freq: Two times a day (BID) | INTRAVENOUS | Status: DC
  Administered 2022-09-06 – 2022-09-08 (×5): 500 mg via INTRAVENOUS
  Filled 2022-09-06 (×5): qty 100

## 2022-09-06 NOTE — Progress Notes (Signed)
Patient admitted early this morning for lower abdominal pain and was found to have acute sigmoid diverticulitis.  He has been started on IV fluids and antibiotics.  Patient seen and examined at bedside and plan of care discussed with him.  I have reviewed patient's medical records including this morning's H&P, current vitals, labs and medications myself.  Continue antibiotics.  Repeat a.m. labs.  Follow cultures.

## 2022-09-06 NOTE — H&P (Signed)
History and Physical  Oscar Young [id number] DOB: 1980/10/14 DOA: 09/05/2022  Referring physician: Vertis Kelch  PCP: Patient, No Pcp Per  Outpatient Specialists: Cardiology Patient coming from: Home through urgent care.  Chief Complaint: Left lower quadrant abdominal pain.  HPI: Oscar Young is a 42 y.o. male with medical history significant for supraventricular tachycardia on Toprol-XL and followed by cardiology, who presented to [facility] ED referred to the ED by urgent care.  He initially presented to urgent care with complaints of left lower quadrant abdominal pain that started yesterday evening.  Endorses poor appetite due to severe abdominal pain.  Denies vomiting or diarrhea.  In the ED, febrile with Tmax 101.1.  Tachycardic with heart rate of 108, moderate leukocytosis 14.1, and elevated lactic acid 2.0.  CT abdomen pelvis with contrast revealed acute uncomplicated sigmoid diverticulitis.  No perforation or abscess seen on CT scan.  Hepatic steatosis.  Nonobstructing left renal stone.  The patient was started on IV ciprofloxacin and IV Flagyl.  TRH, hospitalist service, was asked to admit.  ED Course: Tmax 101.1.  BP 124/85, pulse 84, respiratory rate 14, O2 saturation 99% on room air.  Lab studies remarkable for WBC 14.1, serum potassium 3.4.  Lactic acid 2.0, 2.0.  Review of Systems: Review of systems as noted in the HPI. All other systems reviewed and are negative.   Past Medical History:  Diagnosis Date   SVT (supraventricular tachycardia)    No past surgical history on file.  Social History:  reports that he has never smoked. He does not have any smokeless tobacco history on file. He reports current alcohol use. He reports that he does not use drugs.   No Known Allergies  Family History  Problem Relation Age of Onset   Diabetes Mother       Prior to Admission medications   Medication Sig Start Date End Date Taking? Authorizing Provider  metoprolol succinate  (TOPROL-XL) 25 MG 24 hr tablet TAKE 1 TABLET(25 MG) BY MOUTH DAILY WITH OR IMMEDIATELY FOLLOWING A MEAL 01/20/22   Cantwell, Celeste C, PA-C    Physical Exam: BP 135/88   Pulse 86   Temp 98.9 F (37.2 C) (Oral)   Resp 11   SpO2 99%   General: 42 y.o. year-old male well developed well nourished in no acute distress.  Alert and oriented x3. Cardiovascular: Regular rate and rhythm with no rubs or gallops.  No thyromegaly or JVD noted.  No lower extremity edema. 2/4 pulses in all 4 extremities. Respiratory: Clear to auscultation with no wheezes or rales. Good inspiratory effort. Abdomen: Soft nontender moderately distended with normal bowel sounds x4 quadrants. Muskuloskeletal: No cyanosis, clubbing or edema noted bilaterally Neuro: CN II-XII intact, strength, sensation, reflexes Skin: No ulcerative lesions noted or rashes Psychiatry: Judgement and insight appear normal. Mood is appropriate for condition and setting          Labs on Admission:  Basic Metabolic Panel: Recent Labs  Lab 09/05/22 2057  NA 138  K 3.4*  CL 103  CO2 24  GLUCOSE 98  BUN 7  CREATININE 1.14  CALCIUM 9.5   Liver Function Tests: Recent Labs  Lab 09/05/22 2057  AST 29  ALT 38  ALKPHOS 48  BILITOT 1.2  PROT 7.8  ALBUMIN 4.1   Recent Labs  Lab 09/05/22 2057  LIPASE 38   No results for input(s): "AMMONIA" in the last 168 hours. CBC: Recent Labs  Lab 09/05/22 2057  WBC 14.1*  NEUTROABS 11.6*  HGB  16.7  HCT 49.6  MCV 92.4  PLT 207   Cardiac Enzymes: No results for input(s): "CKTOTAL", "CKMB", "CKMBINDEX", "TROPONINI" in the last 168 hours.  BNP (last 3 results) No results for input(s): "BNP" in the last 8760 hours.  ProBNP (last 3 results) No results for input(s): "PROBNP" in the last 8760 hours.  CBG: No results for input(s): "GLUCAP" in the last 168 hours.  Radiological Exams on Admission: CT Abdomen Pelvis W Contrast  Result Date: 09/05/2022 CLINICAL DATA:  Right lower  quadrant abdominal pain. EXAM: CT ABDOMEN AND PELVIS WITH CONTRAST TECHNIQUE: Multidetector CT imaging of the abdomen and pelvis was performed using the standard protocol following bolus administration of intravenous contrast. RADIATION DOSE REDUCTION: This exam was performed according to the departmental dose-optimization program which includes automated exposure control, adjustment of the mA and/or kV according to patient size and/or use of iterative reconstruction technique. CONTRAST:  42mL OMNIPAQUE IOHEXOL 350 MG/ML SOLN COMPARISON:  None Available. FINDINGS: Lower chest: No basilar airspace disease or pleural effusion. Hepatobiliary: Diffusely decreased hepatic density typical of steatosis. No focal liver abnormalities. Gallbladder physiologically distended, no calcified stone. No biliary dilatation. Pancreas: Unremarkable. No pancreatic ductal dilatation or surrounding inflammatory changes. Spleen: Normal in size without focal abnormality. Adrenals/Urinary Tract: Normal adrenal glands. No hydronephrosis. 6 mm nonobstructing stone in the lower pole of the left kidney. No suspicious renal lesion. Partially distended urinary bladder without wall thickening. Stomach/Bowel: Inflamed mid sigmoid colonic diverticula consistent with diverticulitis. There is adjacent pericolonic fat stranding as well as colonic wall thickening. This is just to the right of midline and likely the cause of right lower quadrant pain. Additional noninflamed colonic diverticula. No perforation or abscess. The appendix is normal. There is minimal submucosal fatty infiltration involving the distal ileal bowel loop suggesting prior inflammation. No acute small bowel inflammatory change. Unremarkable appearance of the stomach. Vascular/Lymphatic: Normal caliber abdominal aorta. Patent portal, splenic, mesenteric vasculature. There multiple small retroperitoneal lymph nodes are not enlarged by size criteria. Reproductive: Prominent prostate  gland spans 5.1 cm transverse. Other: Fat stranding in the pelvis related to diverticulitis. No perforation or free air. Minimal fat in the right inguinal canal. Musculoskeletal: There are no acute or suspicious osseous abnormalities. IMPRESSION: 1. Acute uncomplicated sigmoid diverticulitis. 2. Hepatic steatosis. 3. Nonobstructing left renal stone. Electronically Signed   By: Keith Rake M.D.   On: 09/05/2022 23:43   DG Chest 1 View  Result Date: 09/05/2022 CLINICAL DATA:  Sepsis EXAM: CHEST  1 VIEW COMPARISON:  09/07/2021 FINDINGS: Mild bronchitic changes. No acute airspace disease or effusion. Normal cardiac size. No pneumothorax IMPRESSION: No active disease. Electronically Signed   By: Donavan Foil M.D.   On: 09/05/2022 21:27    EKG: I independently viewed the EKG done and my findings are as followed: Sinus tachycardia rate of 109.  Nonspecific ST changes.  QTc 409.  Assessment/Plan Present on Admission:  Acute diverticulitis  Principal Problem:   Acute diverticulitis  Acute sigmoid diverticulitis without complication, no evidence of abscess or perforation on CT scan. Started on IV Flagyl and IV ciprofloxacin in the ED, continue. Continue gentle IV fluid hydration LR at 50 cc/h x 2 days. Monitor fever curve and WBCs. As needed analgesics As needed antiemetics Follow peripheral blood cultures. Maintain MAP greater than 65  Supraventricular tachycardia Resume home Toprol-XL at lowest dose to avoid hypotension. Monitor on telemetry Closely monitor vital signs.  Hypokalemia Serum potassium 3.4 Repleted orally     DVT prophylaxis: Subcu Lovenox daily  Code Status: Full code  Family Communication: None at bedside.  Disposition Plan: Admitted to telemetry surgical unit  Consults called: None.  Admission status: Inpatient status.   Status is: Inpatient The patient requires at least 2 midnights for further evaluation and treatment of present condition.   Kayleen Memos MD [facility] Pager [phone]  If 7PM-7AM, please contact night-coverage [email or web address] Password [facility]  09/06/2022, 2:37 AM

## 2022-09-06 NOTE — ED Notes (Signed)
ED TO INPATIENT HANDOFF REPORT  ED Nurse Name and Phone #: Anderson Malta [phone]  S Name/Age/Gender Oscar Young 42 y.o. male Room/Bed: 010C/010C  Code Status   Code Status: Full Code  Home/SNF/Other Home Patient oriented to: self, place, time, and situation Is this baseline? Yes   Triage Complete: Triage complete  Chief Complaint Acute diverticulitis [K57.92] Diverticulitis [K57.92]  Triage Note Pt with RLQ pain since last night. Sent from [facility] for eval of appendicitis.  No n/v/d.  Described as coming and going and sharp in nature.  Different pain with position.    Allergies No Known Allergies  Level of Care/Admitting Diagnosis ED Disposition     ED Disposition  Admit   Condition  --   Comment  Hospital Area: [facility] [[id number]]  Level of Care: Med-Surg [16]  May admit patient to [facility] or [facility] if equivalent level of care is available:: No  Covid Evaluation: Asymptomatic - no recent exposure (last 10 days) testing not required  Diagnosis: Diverticulitis [244010]  Admitting Physician: Aline August [[id number]]  Attending Physician: Aline August [[id number]]  Certification:: I certify this patient will need inpatient services for at least 2 midnights          B Medical/Surgery History Past Medical History:  Diagnosis Date   SVT (supraventricular tachycardia)    No past surgical history on file.   A IV Location/Drains/Wounds Patient Lines/Drains/Airways Status     Active Line/Drains/Airways     Name Placement date Placement time Site Days   Peripheral IV 09/05/22 18 G Left Antecubital 09/05/22  2100  Antecubital  1            Intake/Output Last 24 hours  Intake/Output Summary (Last 24 hours) at 09/06/2022 1312 Last data filed at 09/06/2022 0241 Gross per 24 hour  Intake 299.78 ml  Output --  Net 299.78 ml    Labs/Imaging Results for orders placed or performed during the hospital encounter of 09/05/22 (from the past 48  hour(s))  Lactic acid, plasma     Status: Abnormal   Collection Time: 09/05/22  8:57 PM  Result Value Ref Range   Lactic Acid, Venous 2.0 (HH) 0.5 - 1.9 mmol/L    Comment: CRITICAL RESULT CALLED TO, READ BACK BY AND VERIFIED WITH Dossie Der RN 09/05/22 2205 AMIREHSANI F Performed at [facility], [place] [address], [place], [place] [address]   Comprehensive metabolic panel     Status: Abnormal   Collection Time: 09/05/22  8:57 PM  Result Value Ref Range   Sodium 138 135 - 145 mmol/L   Potassium 3.4 (L) 3.5 - 5.1 mmol/L   Chloride 103 98 - 111 mmol/L   CO2 24 22 - 32 mmol/L   Glucose, Bld 98 70 - 99 mg/dL    Comment: Glucose reference range applies only to samples taken after fasting for at least 8 hours.   BUN 7 6 - 20 mg/dL   Creatinine, Ser 1.14 0.61 - 1.24 mg/dL   Calcium 9.5 8.9 - 10.3 mg/dL   Total Protein 7.8 6.5 - 8.1 g/dL   Albumin 4.1 3.5 - 5.0 g/dL   AST 29 15 - 41 U/L   ALT 38 0 - 44 U/L   Alkaline Phosphatase 48 38 - 126 U/L   Total Bilirubin 1.2 0.3 - 1.2 mg/dL   GFR, Estimated >60 >60 mL/min    Comment: (NOTE) Calculated using the CKD-EPI Creatinine Equation (2021)    Anion gap 11 5 -  15    Comment: Performed at [facility], [place] [address], [place], [place] [address]  CBC with Differential     Status: Abnormal   Collection Time: 09/05/22  8:57 PM  Result Value Ref Range   WBC 14.1 (H) 4.0 - 10.5 K/uL   RBC 5.37 4.22 - 5.81 MIL/uL   Hemoglobin 16.7 13.0 - 17.0 g/dL   HCT 49.6 39.0 - 52.0 %   MCV 92.4 80.0 - 100.0 fL   MCH 31.1 26.0 - 34.0 pg   MCHC 33.7 30.0 - 36.0 g/dL   RDW 14.2 11.5 - 15.5 %   Platelets 207 150 - 400 K/uL   nRBC 0.0 0.0 - 0.2 %   Neutrophils Relative % 83 %   Neutro Abs 11.6 (H) 1.7 - 7.7 K/uL   Lymphocytes Relative 10 %   Lymphs Abs 1.5 0.7 - 4.0 K/uL   Monocytes Relative 7 %   Monocytes Absolute 1.0 0.1 - 1.0 K/uL   Eosinophils Relative 0 %   Eosinophils Absolute 0.0 0.0 - 0.5 K/uL   Basophils Relative 0 %   Basophils  Absolute 0.0 0.0 - 0.1 K/uL   Immature Granulocytes 0 %   Abs Immature Granulocytes 0.05 0.00 - 0.07 K/uL    Comment: Performed at [facility] [address], [place], [place] [address]  Protime-INR     Status: None   Collection Time: 09/05/22  8:57 PM  Result Value Ref Range   Prothrombin Time 14.3 11.4 - 15.2 seconds   INR 1.1 0.8 - 1.2    Comment: (NOTE) INR goal varies based on device and disease states. Performed at [facility], [place] [address], [place], [place] [address]   APTT     Status: None   Collection Time: 09/05/22  8:57 PM  Result Value Ref Range   aPTT 28 24 - 36 seconds    Comment: Performed at [facility] [address] [place], [place] [address]  Blood Culture (routine x 2)     Status: None (Preliminary result)   Collection Time: 09/05/22  8:57 PM   Specimen: BLOOD LEFT ARM  Result Value Ref Range   Specimen Description BLOOD LEFT ARM    Special Requests      BOTTLES DRAWN AEROBIC ONLY Blood Culture adequate volume   Culture      NO GROWTH < 12 HOURS Performed at [facility], [place] [address], [place], [place] [address]    Report Status PENDING   Blood Culture (routine x 2)     Status: None (Preliminary result)   Collection Time: 09/05/22  8:57 PM   Specimen: BLOOD RIGHT ARM  Result Value Ref Range   Specimen Description BLOOD RIGHT ARM    Special Requests      BOTTLES DRAWN AEROBIC AND ANAEROBIC Blood Culture adequate volume   Culture      NO GROWTH < 12 HOURS Performed at [facility], [place] [address] [place], [place] [address]    Report Status PENDING   Lipase, blood     Status: None   Collection Time: 09/05/22  8:57 PM  Result Value Ref Range   Lipase 38 11 - 51 U/L    Comment: Performed at [facility], [place] [address], [place], [place] [address]  Urinalysis, w/ Reflex to Culture (Infection Suspected) -Urine, Clean Catch     Status: Abnormal   Collection Time: 09/05/22  9:05 PM  Result Value Ref Range    Specimen Source URINE, CLEAN CATCH    Color, Urine YELLOW YELLOW   APPearance CLEAR CLEAR   Specific Gravity, Urine 1.012 1.005 - 1.030   pH 6.0 5.0 - 8.0   Glucose, UA NEGATIVE NEGATIVE mg/dL   Hgb urine dipstick SMALL (A) NEGATIVE   Bilirubin Urine NEGATIVE NEGATIVE   Ketones, ur 5 (A) NEGATIVE mg/dL   Protein, ur NEGATIVE NEGATIVE mg/dL   Nitrite NEGATIVE NEGATIVE   Leukocytes,Ua NEGATIVE NEGATIVE   RBC / HPF 0-5 0 - 5 RBC/hpf   WBC, UA 0-5 0 - 5 WBC/hpf    Comment:        Reflex urine culture not performed if WBC <=10, OR if Squamous epithelial cells >5. If Squamous epithelial cells >5 suggest recollection.    Bacteria, UA NONE SEEN NONE SEEN   Squamous Epithelial / HPF 0-5 0 - 5 /HPF   Mucus PRESENT     Comment: Performed at [facility], [address], [place], [place] [address]  Lactic acid, plasma     Status: Abnormal   Collection Time: 09/06/22  1:41 AM  Result Value Ref Range   Lactic Acid, Venous 2.0 (HH) 0.5 - 1.9 mmol/L    Comment: CRITICAL RESULT CALLED TO, READ BACK BY AND VERIFIED WITH cobb, a rn 09/06/22 0340 amirehsani f Performed at [facility], [address], [place], [place] [address]   HIV Antibody (routine testing w rflx)     Status: None   Collection Time: 09/06/22  3:00 AM  Result Value Ref Range   HIV Screen 4th Generation wRfx Non Reactive Non Reactive    Comment: Performed at [facility], [address], [place], [place] [address]  CBC     Status: Abnormal   Collection Time: 09/06/22  3:00 AM  Result Value Ref Range   WBC 11.4 (H) 4.0 - 10.5 K/uL   RBC 4.97 4.22 - 5.81 MIL/uL   Hemoglobin 15.3 13.0 - 17.0 g/dL   HCT 70.6 23.7 - 62.8 %   MCV 94.8 80.0 - 100.0 fL   MCH 30.8 26.0 - 34.0 pg   MCHC 32.5 30.0 - 36.0 g/dL   RDW 31.5 17.6 - 16.0 %   Platelets 183 150 - 400 K/uL   nRBC 0.0 0.0 - 0.2 %    Comment: Performed at [facility], [address] [place], [place] [address]  Creatinine, serum     Status: None    Collection Time: 09/06/22  3:00 AM  Result Value Ref Range   Creatinine, Ser 1.18 0.61 - 1.24 mg/dL   GFR, Estimated >62 >69 mL/min    Comment: (NOTE) Calculated using the CKD-EPI Creatinine Equation (2021) Performed at [facility], [address], [place], [place] [address]   Lactic acid, plasma     Status: None   Collection Time: 09/06/22  7:50 AM  Result Value Ref Range   Lactic Acid, Venous 1.2 0.5 - 1.9 mmol/L    Comment: Performed at [facility], [address], [place], [place] [address]  Comprehensive metabolic panel     Status: Abnormal   Collection Time: 09/06/22  7:56 AM  Result Value Ref Range   Sodium 139 135 - 145 mmol/L   Potassium 3.8 3.5 - 5.1 mmol/L   Chloride 105 98 - 111 mmol/L   CO2 25 22 - 32 mmol/L   Glucose, Bld 101 (H) 70 - 99  mg/dL    Comment: Glucose reference range applies only to samples taken after fasting for at least 8 hours.   BUN 7 6 - 20 mg/dL   Creatinine, Ser 3.47 0.61 - 1.24 mg/dL   Calcium 9.0 8.9 - 42.5 mg/dL   Total Protein 7.0 6.5 - 8.1 g/dL   Albumin 3.5 3.5 - 5.0 g/dL   AST 25 15 - 41 U/L   ALT 31 0 - 44 U/L   Alkaline Phosphatase 39 38 - 126 U/L   Total Bilirubin 1.3 (H) 0.3 - 1.2 mg/dL   GFR, Estimated >95 >63 mL/min    Comment: (NOTE) Calculated using the CKD-EPI Creatinine Equation (2021)    Anion gap 9 5 - 15    Comment: Performed at [facility], [address], [place], [place] [address]  Magnesium     Status: None   Collection Time: 09/06/22  7:56 AM  Result Value Ref Range   Magnesium 1.9 1.7 - 2.4 mg/dL    Comment: Performed at [facility], [address], [place], [place] [address]  Phosphorus     Status: None   Collection Time: 09/06/22  7:56 AM  Result Value Ref Range   Phosphorus 3.8 2.5 - 4.6 mg/dL    Comment: Performed at [facility], [address] [place], [place] [address]   CT Abdomen Pelvis W Contrast  Result Date: 09/05/2022 CLINICAL DATA:  Right lower quadrant  abdominal pain. EXAM: CT ABDOMEN AND PELVIS WITH CONTRAST TECHNIQUE: Multidetector CT imaging of the abdomen and pelvis was performed using the standard protocol following bolus administration of intravenous contrast. RADIATION DOSE REDUCTION: This exam was performed according to the departmental dose-optimization program which includes automated exposure control, adjustment of the mA and/or kV according to patient size and/or use of iterative reconstruction technique. CONTRAST:  75mL OMNIPAQUE IOHEXOL 350 MG/ML SOLN COMPARISON:  None Available. FINDINGS: Lower chest: No basilar airspace disease or pleural effusion. Hepatobiliary: Diffusely decreased hepatic density typical of steatosis. No focal liver abnormalities. Gallbladder physiologically distended, no calcified stone. No biliary dilatation. Pancreas: Unremarkable. No pancreatic ductal dilatation or surrounding inflammatory changes. Spleen: Normal in size without focal abnormality. Adrenals/Urinary Tract: Normal adrenal glands. No hydronephrosis. 6 mm nonobstructing stone in the lower pole of the left kidney. No suspicious renal lesion. Partially distended urinary bladder without wall thickening. Stomach/Bowel: Inflamed mid sigmoid colonic diverticula consistent with diverticulitis. There is adjacent pericolonic fat stranding as well as colonic wall thickening. This is just to the right of midline and likely the cause of right lower quadrant pain. Additional noninflamed colonic diverticula. No perforation or abscess. The appendix is normal. There is minimal submucosal fatty infiltration involving the distal ileal bowel loop suggesting prior inflammation. No acute small bowel inflammatory change. Unremarkable appearance of the stomach. Vascular/Lymphatic: Normal caliber abdominal aorta. Patent portal, splenic, mesenteric vasculature. There multiple small retroperitoneal lymph nodes are not enlarged by size criteria. Reproductive: Prominent prostate gland spans  5.1 cm transverse. Other: Fat stranding in the pelvis related to diverticulitis. No perforation or free air. Minimal fat in the right inguinal canal. Musculoskeletal: There are no acute or suspicious osseous abnormalities. IMPRESSION: 1. Acute uncomplicated sigmoid diverticulitis. 2. Hepatic steatosis. 3. Nonobstructing left renal stone. Electronically Signed   By: Narda Rutherford M.D.   On: 09/05/2022 23:43   DG Chest 1 View  Result Date: 09/05/2022 CLINICAL DATA:  Sepsis EXAM: CHEST  1 VIEW COMPARISON:  09/07/2021 FINDINGS: Mild bronchitic changes. No acute airspace  disease or effusion. Normal cardiac size. No pneumothorax IMPRESSION: No active disease. Electronically Signed   By: Jasmine Pang M.D.   On: 09/05/2022 21:27    Pending Labs Unresulted Labs (From admission, onward)     Start     Ordered   09/13/22 0500  Creatinine, serum  (enoxaparin (LOVENOX)    CrCl >/= 30 ml/min)  Weekly,   R     Comments: while on enoxaparin therapy    09/06/22 0236   09/07/22 0500  CBC with Differential/Platelet  Tomorrow morning,   R        09/06/22 1041   09/07/22 0500  Basic metabolic panel  Tomorrow morning,   R        09/06/22 1041   09/07/22 0500  Magnesium  Tomorrow morning,   R        09/06/22 1041   09/07/22 0500  C-reactive protein  Tomorrow morning,   R        09/06/22 1041   09/06/22 0614  Lactic acid, plasma  STAT Now then every 3 hours,   R (with STAT occurrences)      09/06/22 0614            Vitals/Pain Today's Vitals   09/06/22 0830 09/06/22 0900 09/06/22 0930 09/06/22 1053  BP: (!) 120/92 135/89 (!) 126/90   Pulse: 87 87 90   Resp: 10 15 14    Temp:    98.5 F (36.9 C)  TempSrc:    Oral  SpO2: 98% 98% 99%   PainSc:        Isolation Precautions No active isolations  Medications Medications  enoxaparin (LOVENOX) injection 40 mg (40 mg Subcutaneous Given 09/06/22 0944)  oxyCODONE (Oxy IR/ROXICODONE) immediate release tablet 5 mg (has no administration in time range)   HYDROmorphone (DILAUDID) injection 0.5 mg (has no administration in time range)  ciprofloxacin (CIPRO) IVPB 400 mg (0 mg Intravenous Stopped 09/06/22 1312)  metroNIDAZOLE (FLAGYL) IVPB 500 mg (0 mg Intravenous Stopped 09/06/22 1311)  lactated ringers infusion ( Intravenous New Bag/Given 09/06/22 0705)  metoprolol succinate (TOPROL-XL) 24 hr tablet 12.5 mg (12.5 mg Oral Given 09/06/22 0654)  ondansetron (ZOFRAN) injection 4 mg (has no administration in time range)  iohexol (OMNIPAQUE) 350 MG/ML injection 75 mL (75 mLs Intravenous Contrast Given 09/05/22 2331)  ciprofloxacin (CIPRO) IVPB 400 mg (0 mg Intravenous Stopped 09/06/22 0241)  metroNIDAZOLE (FLAGYL) IVPB 500 mg (0 mg Intravenous Stopped 09/06/22 0241)  lactated ringers bolus 1,000 mL (0 mLs Intravenous Stopped 09/06/22 0351)  acetaminophen (TYLENOL) tablet 1,000 mg (1,000 mg Oral Given 09/06/22 0132)  morphine (PF) 4 MG/ML injection 4 mg (4 mg Intravenous Given 09/06/22 0134)  potassium chloride Young (KLOR-CON M) CR tablet 40 mEq (40 mEq Oral Given 09/06/22 0654)    Mobility walks     Focused Assessments Cardiac Assessment Handoff:    No results found for: "CKTOTAL", "CKMB", "CKMBINDEX", "TROPONINI" No results found for: "DDIMER" Does the Patient currently have chest pain? No   , Pulmonary Assessment Handoff:  Lung sounds:   O2 Device: Room Air      R Recommendations: See Admitting Provider Note  Report given to:   Additional Notes:

## 2022-09-06 NOTE — ED Provider Notes (Signed)
[facility] Emergency Department Provider Note MRN:  [id number]  Arrival date & time: 09/06/22     Chief Complaint   Abdominal Pain   History of Present Illness   Oscar Young is a 42 y.o. year-old male presents to the ED with chief complaint of abdominal pain.  Onset was last night.  Has had worsening symptoms throughout the day today.  Reports associated anorexia.  Complains of left lower abdominal tenderness.  Was seen at outside urgent care and sent to the emergency department for evaluation for suspected appendicitis.  History provided by patient.   Review of Systems  Pertinent positive and negative review of systems noted in HPI.    Physical Exam   Vitals:   09/06/22 0141 09/06/22 0145  BP:  135/88  Pulse:  86  Resp:  11  Temp: 98.9 F (37.2 C)   SpO2:  99%    CONSTITUTIONAL: Slightly uncomfortable-appearing, NAD NEURO:  Alert and oriented x 3, CN 3-12 grossly intact EYES:  eyes equal and reactive ENT/NECK:  Supple, no stridor  CARDIO: Tachycardic, regular rhythm, appears well-perfused  PULM:  No respiratory distress, CTAB GI/GU:  non-distended, left lower quadrant tenderness to palpation MSK/SPINE:  No gross deformities, no edema, moves all extremities  SKIN:  no rash, atraumatic   *Additional and/or pertinent findings included in MDM below  Diagnostic and Interventional Summary    EKG Interpretation  Date/Time:    Ventricular Rate:    PR Interval:    QRS Duration:   QT Interval:    QTC Calculation:   R Axis:     Text Interpretation:         Labs Reviewed  LACTIC ACID, PLASMA - Abnormal; Notable for the following components:      Result Value   Lactic Acid, Venous 2.0 (*)    All other components within normal limits  COMPREHENSIVE METABOLIC PANEL - Abnormal; Notable for the following components:   Potassium 3.4 (*)    All other components within normal limits  CBC WITH DIFFERENTIAL/PLATELET - Abnormal; Notable for the  following components:   WBC 14.1 (*)    Neutro Abs 11.6 (*)    All other components within normal limits  URINALYSIS, W/ REFLEX TO CULTURE (INFECTION SUSPECTED) - Abnormal; Notable for the following components:   Hgb urine dipstick SMALL (*)    Ketones, ur 5 (*)    All other components within normal limits  CULTURE, BLOOD (ROUTINE X 2)  CULTURE, BLOOD (ROUTINE X 2)  PROTIME-INR  APTT  LIPASE, BLOOD  LACTIC ACID, PLASMA    CT Abdomen Pelvis W Contrast  Final Result    DG Chest 1 View  Final Result      Medications  ciprofloxacin (CIPRO) IVPB 400 mg (400 mg Intravenous New Bag/Given 09/06/22 0138)  metroNIDAZOLE (FLAGYL) IVPB 500 mg (500 mg Intravenous New Bag/Given 09/06/22 0139)  iohexol (OMNIPAQUE) 350 MG/ML injection 75 mL (75 mLs Intravenous Contrast Given 09/05/22 2331)  lactated ringers bolus 1,000 mL (1,000 mLs Intravenous New Bag/Given 09/06/22 0134)  acetaminophen (TYLENOL) tablet 1,000 mg (1,000 mg Oral Given 09/06/22 0132)  morphine (PF) 4 MG/ML injection 4 mg (4 mg Intravenous Given 09/06/22 0134)     Procedures  /  Critical Care .Critical Care  Performed by: Montine Circle, PA-C Authorized by: Montine Circle, PA-C   Critical care provider statement:    Critical care time (minutes):  32   Critical care was necessary to treat or prevent imminent or life-threatening deterioration  of the following conditions:  Sepsis   Critical care was time spent personally by me on the following activities:  Development of treatment plan with patient or surrogate, discussions with consultants, evaluation of patient's response to treatment, examination of patient, ordering and review of laboratory studies, ordering and review of radiographic studies, ordering and performing treatments and interventions, pulse oximetry, re-evaluation of patient's condition and review of old charts   ED Course and Medical Decision Making  I have reviewed the triage vital signs, the nursing notes, and  pertinent available records from the EMR.  Social Determinants Affecting Complexity of Care: Patient has no clinically significant social determinants affecting this chief complaint..   ED Course:    Medical Decision Making Patient here with left lower abdominal pain for the past day.  CT ordered in triage notable for acute diverticulitis without abscess or perforation.  Patient is still quite uncomfortable on physical exam.  He is tachycardic and febrile.  Getting fluids and IV antibiotic.  Will admit to hospitalist due to persistent pain and meeting SIRs criteria.  Problems Addressed: Diverticulitis: acute illness or injury  Amount and/or Complexity of Data Reviewed Labs: ordered.    Details: Leukocytosis to 14.1, no significant electrolyte derangement Lactic acid is 2.0  Risk OTC drugs. Prescription drug management. Decision regarding hospitalization.     Consultants: I discussed the case with Hospitalist, Dr. Nevada Crane, who is appreciated for admitting.   Treatment and Plan: Patient's exam and diagnostic results are concerning for diverticulitis and sepsis.  Feel that patient will need admission to the hospital for further treatment and evaluation.    Final Clinical Impressions(s) / ED Diagnoses     ICD-10-CM   1. Diverticulitis  K57.92       ED Discharge Orders     None         Discharge Instructions Discussed with and Provided to Patient:   Discharge Instructions   None      Montine Circle, PA-C 09/06/22 0354    Fatima Blank, MD 09/06/22 [phone]

## 2022-09-07 DIAGNOSIS — K5792 Diverticulitis of intestine, part unspecified, without perforation or abscess without bleeding: Secondary | ICD-10-CM | POA: Diagnosis not present

## 2022-09-07 LAB — CBC WITH DIFFERENTIAL/PLATELET
Abs Immature Granulocytes: 0.02 10*3/uL (ref 0.00–0.07)
Basophils Absolute: 0 10*3/uL (ref 0.0–0.1)
Basophils Relative: 0 %
Eosinophils Absolute: 0 10*3/uL (ref 0.0–0.5)
Eosinophils Relative: 0 %
HCT: 41.7 % (ref 39.0–52.0)
Hemoglobin: 14.4 g/dL (ref 13.0–17.0)
Immature Granulocytes: 0 %
Lymphocytes Relative: 20 %
Lymphs Abs: 1.6 10*3/uL (ref 0.7–4.0)
MCH: 31.6 pg (ref 26.0–34.0)
MCHC: 34.5 g/dL (ref 30.0–36.0)
MCV: 91.6 fL (ref 80.0–100.0)
Monocytes Absolute: 0.7 10*3/uL (ref 0.1–1.0)
Monocytes Relative: 8 %
Neutro Abs: 5.9 10*3/uL (ref 1.7–7.7)
Neutrophils Relative %: 72 %
Platelets: 192 10*3/uL (ref 150–400)
RBC: 4.55 MIL/uL (ref 4.22–5.81)
RDW: 14 % (ref 11.5–15.5)
WBC: 8.2 10*3/uL (ref 4.0–10.5)
nRBC: 0 % (ref 0.0–0.2)

## 2022-09-07 LAB — BASIC METABOLIC PANEL
Anion gap: 10 (ref 5–15)
BUN: 9 mg/dL (ref 6–20)
CO2: 22 mmol/L (ref 22–32)
Calcium: 8.6 mg/dL — ABNORMAL LOW (ref 8.9–10.3)
Chloride: 103 mmol/L (ref 98–111)
Creatinine, Ser: 1.17 mg/dL (ref 0.61–1.24)
GFR, Estimated: >60 mL/min (ref >60)
Glucose, Bld: 79 mg/dL (ref 70–99)
Potassium: 3.4 mmol/L — ABNORMAL LOW (ref 3.5–5.1)
Sodium: 135 mmol/L (ref 135–145)

## 2022-09-07 LAB — C-REACTIVE PROTEIN: CRP: 14.6 mg/dL — ABNORMAL HIGH (ref <1.0)

## 2022-09-07 LAB — MAGNESIUM: Magnesium: 1.9 mg/dL (ref 1.7–2.4)

## 2022-09-07 MED ORDER — POTASSIUM CHLORIDE CRYS ER 20 MEQ PO TBCR
40.0000 meq | EXTENDED_RELEASE_TABLET | Freq: Once | ORAL | Status: AC
  Administered 2022-09-07: 40 meq via ORAL
  Filled 2022-09-07: qty 2

## 2022-09-07 NOTE — Progress Notes (Signed)
PROGRESS NOTE    Oscar Young  OZD:[id number] DOB: Oct 15, 1980 DOA: 09/05/2022 PCP: Patient, No Pcp Per   Brief Narrative:  42 y.o. male with medical history significant for supraventricular tachycardia presented with worsening lower abdominal pain.  On presentation, he was febrile to 101.1, tachycardic with leukocytosis and elevated lactic acid. CT abdomen pelvis with contrast revealed acute uncomplicated sigmoid diverticulitis without perforation or abscess.  He was started on IV antibiotics.  Assessment & Plan:   Acute sigmoid diverticulitis with no abscess or perforation Severe sepsis: Present on admission -Presented with fever, tachycardia, leukocytosis, elevated lactic acid from sigmoid diverticulitis -Continue Cipro and Flagyl IV.  Currently on clear liquid diet.  Advance diet to soft diet.  Still complains of intermittent abdominal pain.  No temperature spikes over the last 24 hours -Decrease IV fluids to 50 cc an hour -Blood cultures negative so far  Lactic acidosis -From above.  Resolved.  Leukocytosis Resolved  Hypokalemia-replace.  Repeat a.m. labs  History of supraventricular tachycardia -Rate controlled.  Continue Toprol-XL  DVT prophylaxis: Lovenox Code Status: Full Family Communication: None at bedside Disposition Plan: Status is: Inpatient Remains inpatient appropriate because: Of severity of illness  Consultants: None  Procedures: None  Antimicrobials: Cipro and Flagyl from 09/05/2022 onwards   Subjective: Patient seen and examined at bedside.  Feels slightly better but still complains of intermittent lower abdominal pain.  Tolerating diet and wants to advance it.  No fever or vomiting reported.  Objective: Vitals:   09/06/22 1950 09/07/22 0005 09/07/22 0602 09/07/22 0837  BP: (!) 139/90 128/82 131/87 120/85  Pulse: 90 85 95 89  Resp: 17 16 17 17   Temp: 98.9 F (37.2 C) 98 F (36.7 C)  98.4 F (36.9 C)  TempSrc: Oral Oral  Oral  SpO2: 100% 100% 96%  95%    Intake/Output Summary (Last 24 hours) at 09/07/2022 1038 Last data filed at 09/07/2022 0900 Gross per 24 hour  Intake 857.32 ml  Output --  Net 857.32 ml   There were no vitals filed for this visit.  Examination:  General: On room air.  No distress.  respiratory: Decreased breath sounds at bases bilaterally with some crackles CVS: Currently rate controlled; S1-S2 heard  abdominal: Soft, mild lower quadrant tenderness present, slightly distended, no organomegaly; normal bowel sounds are heard  extremities: Trace lower extremity edema; no clubbing.     Data Reviewed: I have personally reviewed following labs and imaging studies  CBC: Recent Labs  Lab 09/05/22 2057 09/06/22 0300 09/07/22 0257  WBC 14.1* 11.4* 8.2  NEUTROABS 11.6*  --  5.9  HGB 16.7 15.3 14.4  HCT 49.6 47.1 41.7  MCV 92.4 94.8 91.6  PLT 207 183 259   Basic Metabolic Panel: Recent Labs  Lab 09/05/22 2057 09/06/22 0300 09/06/22 0756 09/07/22 0257  NA 138  --  139 135  K 3.4*  --  3.8 3.4*  CL 103  --  105 103  CO2 24  --  25 22  GLUCOSE 98  --  101* 79  BUN 7  --  7 9  CREATININE 1.14 1.18 1.11 1.17  CALCIUM 9.5  --  9.0 8.6*  MG  --   --  1.9 1.9  PHOS  --   --  3.8  --    GFR: CrCl cannot be calculated (Unknown ideal weight.). Liver Function Tests: Recent Labs  Lab 09/05/22 2057 09/06/22 0756  AST 29 25  ALT 38 31  ALKPHOS 48 39  BILITOT  1.2 1.3*  PROT 7.8 7.0  ALBUMIN 4.1 3.5   Recent Labs  Lab 09/05/22 2057  LIPASE 38   No results for input(s): "AMMONIA" in the last 168 hours. Coagulation Profile: Recent Labs  Lab 09/05/22 2057  INR 1.1   Cardiac Enzymes: No results for input(s): "CKTOTAL", "CKMB", "CKMBINDEX", "TROPONINI" in the last 168 hours. BNP (last 3 results) No results for input(s): "PROBNP" in the last 8760 hours. HbA1C: No results for input(s): "HGBA1C" in the last 72 hours. CBG: No results for input(s): "GLUCAP" in the last 168 hours. Lipid  Profile: No results for input(s): "CHOL", "HDL", "LDLCALC", "TRIG", "CHOLHDL", "LDLDIRECT" in the last 72 hours. Thyroid Function Tests: No results for input(s): "TSH", "T4TOTAL", "FREET4", "T3FREE", "THYROIDAB" in the last 72 hours. Anemia Panel: No results for input(s): "VITAMINB12", "FOLATE", "FERRITIN", "TIBC", "IRON", "RETICCTPCT" in the last 72 hours. Sepsis Labs: Recent Labs  Lab 09/05/22 2057 09/06/22 0141 09/06/22 0750 09/06/22 1623  LATICACIDVEN 2.0* 2.0* 1.2 1.3    Recent Results (from the past 240 hour(s))  Blood Culture (routine x 2)     Status: None (Preliminary result)   Collection Time: 09/05/22  8:57 PM   Specimen: BLOOD LEFT ARM  Result Value Ref Range Status   Specimen Description BLOOD LEFT ARM  Final   Special Requests   Final    BOTTLES DRAWN AEROBIC ONLY Blood Culture adequate volume   Culture   Final    NO GROWTH 2 DAYS Performed at [facility], [address] [place], [place] [address]    Report Status PENDING  Incomplete  Blood Culture (routine x 2)     Status: None (Preliminary result)   Collection Time: 09/05/22  8:57 PM   Specimen: BLOOD RIGHT ARM  Result Value Ref Range Status   Specimen Description BLOOD RIGHT ARM  Final   Special Requests   Final    BOTTLES DRAWN AEROBIC AND ANAEROBIC Blood Culture adequate volume   Culture   Final    NO GROWTH 2 DAYS Performed at [facility], [address], [place], [place] [address]    Report Status PENDING  Incomplete         Radiology Studies: CT Abdomen Pelvis W Contrast  Result Date: 09/05/2022 CLINICAL DATA:  Right lower quadrant abdominal pain. EXAM: CT ABDOMEN AND PELVIS WITH CONTRAST TECHNIQUE: Multidetector CT imaging of the abdomen and pelvis was performed using the standard protocol following bolus administration of intravenous contrast. RADIATION DOSE REDUCTION: This exam was performed according to the departmental dose-optimization program which includes automated  exposure control, adjustment of the mA and/or kV according to patient size and/or use of iterative reconstruction technique. CONTRAST:  16mL OMNIPAQUE IOHEXOL 350 MG/ML SOLN COMPARISON:  None Available. FINDINGS: Lower chest: No basilar airspace disease or pleural effusion. Hepatobiliary: Diffusely decreased hepatic density typical of steatosis. No focal liver abnormalities. Gallbladder physiologically distended, no calcified stone. No biliary dilatation. Pancreas: Unremarkable. No pancreatic ductal dilatation or surrounding inflammatory changes. Spleen: Normal in size without focal abnormality. Adrenals/Urinary Tract: Normal adrenal glands. No hydronephrosis. 6 mm nonobstructing stone in the lower pole of the left kidney. No suspicious renal lesion. Partially distended urinary bladder without wall thickening. Stomach/Bowel: Inflamed mid sigmoid colonic diverticula consistent with diverticulitis. There is adjacent pericolonic fat stranding as well as colonic wall thickening. This is just to the right of midline and likely the cause of right lower quadrant pain. Additional noninflamed colonic diverticula. No perforation or abscess. The appendix is normal.  There is minimal submucosal fatty infiltration involving the distal ileal bowel loop suggesting prior inflammation. No acute small bowel inflammatory change. Unremarkable appearance of the stomach. Vascular/Lymphatic: Normal caliber abdominal aorta. Patent portal, splenic, mesenteric vasculature. There multiple small retroperitoneal lymph nodes are not enlarged by size criteria. Reproductive: Prominent prostate gland spans 5.1 cm transverse. Other: Fat stranding in the pelvis related to diverticulitis. No perforation or free air. Minimal fat in the right inguinal canal. Musculoskeletal: There are no acute or suspicious osseous abnormalities. IMPRESSION: 1. Acute uncomplicated sigmoid diverticulitis. 2. Hepatic steatosis. 3. Nonobstructing left renal stone.  Electronically Signed   By: Keith Rake M.D.   On: 09/05/2022 23:43   DG Chest 1 View  Result Date: 09/05/2022 CLINICAL DATA:  Sepsis EXAM: CHEST  1 VIEW COMPARISON:  09/07/2021 FINDINGS: Mild bronchitic changes. No acute airspace disease or effusion. Normal cardiac size. No pneumothorax IMPRESSION: No active disease. Electronically Signed   By: Donavan Foil M.D.   On: 09/05/2022 21:27        Scheduled Meds:  enoxaparin (LOVENOX) injection  40 mg Subcutaneous Q24H   metoprolol succinate  12.5 mg Oral Daily   Continuous Infusions:  ciprofloxacin 400 mg (09/07/22 0920)   lactated ringers 75 mL/hr at 09/06/22 2244   metronidazole 500 mg (09/07/22 0920)          Aline August, MD [facility] 09/07/2022, 10:38 AM

## 2022-09-07 NOTE — Plan of Care (Signed)

## 2022-09-08 DIAGNOSIS — K5792 Diverticulitis of intestine, part unspecified, without perforation or abscess without bleeding: Secondary | ICD-10-CM | POA: Diagnosis not present

## 2022-09-08 LAB — BASIC METABOLIC PANEL
Anion gap: 10 (ref 5–15)
BUN: 8 mg/dL (ref 6–20)
CO2: 23 mmol/L (ref 22–32)
Calcium: 8.8 mg/dL — ABNORMAL LOW (ref 8.9–10.3)
Chloride: 104 mmol/L (ref 98–111)
Creatinine, Ser: 1.12 mg/dL (ref 0.61–1.24)
GFR, Estimated: >60 mL/min (ref >60)
Glucose, Bld: 81 mg/dL (ref 70–99)
Potassium: 3.8 mmol/L (ref 3.5–5.1)
Sodium: 137 mmol/L (ref 135–145)

## 2022-09-08 LAB — CBC WITH DIFFERENTIAL/PLATELET
Abs Immature Granulocytes: 0.02 10*3/uL (ref 0.00–0.07)
Basophils Absolute: 0 10*3/uL (ref 0.0–0.1)
Basophils Relative: 0 %
Eosinophils Absolute: 0 10*3/uL (ref 0.0–0.5)
Eosinophils Relative: 1 %
HCT: 42.2 % (ref 39.0–52.0)
Hemoglobin: 14.7 g/dL (ref 13.0–17.0)
Immature Granulocytes: 0 %
Lymphocytes Relative: 24 %
Lymphs Abs: 1.2 10*3/uL (ref 0.7–4.0)
MCH: 31.9 pg (ref 26.0–34.0)
MCHC: 34.8 g/dL (ref 30.0–36.0)
MCV: 91.5 fL (ref 80.0–100.0)
Monocytes Absolute: 0.4 10*3/uL (ref 0.1–1.0)
Monocytes Relative: 8 %
Neutro Abs: 3.4 10*3/uL (ref 1.7–7.7)
Neutrophils Relative %: 67 %
Platelets: 204 10*3/uL (ref 150–400)
RBC: 4.61 MIL/uL (ref 4.22–5.81)
RDW: 13.5 % (ref 11.5–15.5)
WBC: 5.1 10*3/uL (ref 4.0–10.5)
nRBC: 0 % (ref 0.0–0.2)

## 2022-09-08 LAB — MAGNESIUM: Magnesium: 2 mg/dL (ref 1.7–2.4)

## 2022-09-08 MED ORDER — CIPROFLOXACIN HCL 500 MG PO TABS: 500.0000 mg | ORAL_TABLET | Freq: Two times a day (BID) | ORAL | 0 refills | Status: AC

## 2022-09-08 MED ORDER — OXYCODONE HCL 5 MG PO TABS: 5.0000 mg | ORAL_TABLET | Freq: Four times a day (QID) | ORAL | 0 refills | Status: DC | PRN

## 2022-09-08 MED ORDER — METRONIDAZOLE 500 MG PO TABS: 500.0000 mg | ORAL_TABLET | Freq: Three times a day (TID) | ORAL | 0 refills | Status: AC

## 2022-09-08 NOTE — TOC CM/SW Note (Signed)
Went to see patient at bedside regarding PCP. Patient has already been discharged

## 2022-09-08 NOTE — Discharge Summary (Addendum)
Physician Discharge Summary  Oscar Young OJJ:[id number] DOB: 01-06-1981 DOA: 09/05/2022  PCP: Patient, No Pcp Per  Admit date: 09/05/2022 Discharge date: 09/08/2022  Admitted From: Home Disposition: Home  Recommendations for Outpatient Follow-up:  Follow up with PCP in 1 week with repeat CBC/BMP Outpatient evaluation and follow-up by GI Follow up in ED if symptoms worsen or new appear   Home Health: No Equipment/Devices: None  Discharge Condition: Stable CODE STATUS: Full Diet recommendation: Soft diet  Brief/Interim Summary: 42 y.o. male with medical history significant for supraventricular tachycardia presented with worsening lower abdominal pain.  On presentation, he was febrile to 101.1, tachycardic with leukocytosis and elevated lactic acid. CT abdomen pelvis with contrast revealed acute uncomplicated sigmoid diverticulitis without perforation or abscess.  He was started on IV antibiotics.  Subsequently, his condition has improved and remained stable.  He is tolerating advanced diet.  Cultures have been negative so far.  He will be discharged home today on oral antibiotics.  Outpatient follow-up with PCP.  Discharge Diagnoses:   Acute sigmoid diverticulitis with no abscess or perforation Sepsis: Present on admission -Presented with fever, tachycardia, leukocytosis, elevated lactic acid from sigmoid diverticulitis -Currently on IV fluids and Cipro and Flagyl IV.  Tolerating advanced diet.  No temperature spikes over the last 48 hours -Blood cultures negative so far -Patient feels better and feels okay to go home today.  Discharge home today on oral ciprofloxacin and Flagyl.  Outpatient follow-up with PCP.  Will need outpatient evaluation and follow-up by GI in 6 to 8 weeks for possible colonoscopy.   Lactic acidosis -From above.  Resolved.   Leukocytosis Resolved   Hypokalemia-improved   History of supraventricular tachycardia -Rate controlled.  Continue  Toprol-XL  Obesity -Outpatient follow-up  Discharge Instructions   Allergies as of 09/08/2022   No Known Allergies      Medication List     TAKE these medications    ciprofloxacin 500 MG tablet Commonly known as: Cipro Take 1 tablet (500 mg total) by mouth 2 (two) times daily for 10 days.   metoprolol succinate 25 MG 24 hr tablet Commonly known as: TOPROL-XL TAKE 1 TABLET(25 MG) BY MOUTH DAILY WITH OR IMMEDIATELY FOLLOWING A MEAL What changed: See the new instructions.   metroNIDAZOLE 500 MG tablet Commonly known as: Flagyl Take 1 tablet (500 mg total) by mouth 3 (three) times daily for 10 days.   oxyCODONE 5 MG immediate release tablet Commonly known as: Oxy IR/ROXICODONE Take 1 tablet (5 mg total) by mouth every 6 (six) hours as needed for moderate pain.        Follow-up Information     PCP. Schedule an appointment as soon as possible for a visit in 1 week(s).                 No Known Allergies  Consultations: None   Procedures/Studies: CT Abdomen Pelvis W Contrast  Result Date: 09/05/2022 CLINICAL DATA:  Right lower quadrant abdominal pain. EXAM: CT ABDOMEN AND PELVIS WITH CONTRAST TECHNIQUE: Multidetector CT imaging of the abdomen and pelvis was performed using the standard protocol following bolus administration of intravenous contrast. RADIATION DOSE REDUCTION: This exam was performed according to the departmental dose-optimization program which includes automated exposure control, adjustment of the mA and/or kV according to patient size and/or use of iterative reconstruction technique. CONTRAST:  2mL OMNIPAQUE IOHEXOL 350 MG/ML SOLN COMPARISON:  None Available. FINDINGS: Lower chest: No basilar airspace disease or pleural effusion. Hepatobiliary: Diffusely decreased hepatic density typical of  steatosis. No focal liver abnormalities. Gallbladder physiologically distended, no calcified stone. No biliary dilatation. Pancreas: Unremarkable. No pancreatic  ductal dilatation or surrounding inflammatory changes. Spleen: Normal in size without focal abnormality. Adrenals/Urinary Tract: Normal adrenal glands. No hydronephrosis. 6 mm nonobstructing stone in the lower pole of the left kidney. No suspicious renal lesion. Partially distended urinary bladder without wall thickening. Stomach/Bowel: Inflamed mid sigmoid colonic diverticula consistent with diverticulitis. There is adjacent pericolonic fat stranding as well as colonic wall thickening. This is just to the right of midline and likely the cause of right lower quadrant pain. Additional noninflamed colonic diverticula. No perforation or abscess. The appendix is normal. There is minimal submucosal fatty infiltration involving the distal ileal bowel loop suggesting prior inflammation. No acute small bowel inflammatory change. Unremarkable appearance of the stomach. Vascular/Lymphatic: Normal caliber abdominal aorta. Patent portal, splenic, mesenteric vasculature. There multiple small retroperitoneal lymph nodes are not enlarged by size criteria. Reproductive: Prominent prostate gland spans 5.1 cm transverse. Other: Fat stranding in the pelvis related to diverticulitis. No perforation or free air. Minimal fat in the right inguinal canal. Musculoskeletal: There are no acute or suspicious osseous abnormalities. IMPRESSION: 1. Acute uncomplicated sigmoid diverticulitis. 2. Hepatic steatosis. 3. Nonobstructing left renal stone. Electronically Signed   By: Keith Rake M.D.   On: 09/05/2022 23:43   DG Chest 1 View  Result Date: 09/05/2022 CLINICAL DATA:  Sepsis EXAM: CHEST  1 VIEW COMPARISON:  09/07/2021 FINDINGS: Mild bronchitic changes. No acute airspace disease or effusion. Normal cardiac size. No pneumothorax IMPRESSION: No active disease. Electronically Signed   By: Donavan Foil M.D.   On: 09/05/2022 21:27      Subjective: Patient seen and examined at bedside.  Feels better tolerating diet.  Feels okay to go  home today.  Still complains of mild lower abdominal pain.  No fever, vomiting reported.  Discharge Exam: Vitals:   09/07/22 2041 09/08/22 0435  BP: 118/77 111/87  Pulse: 88 98  Resp: 18 18  Temp: 99.9 F (37.7 C) 98.3 F (36.8 C)  SpO2: 96% 95%    General: Pt is alert, awake, not in acute distress.  On room air. Cardiovascular: rate controlled, S1/S2 + Respiratory: bilateral decreased breath sounds at bases Abdominal: Soft, obese, very mildly tender in the lower abdomen, ND, bowel sounds + Extremities: no edema, no cyanosis    The results of significant diagnostics from this hospitalization (including imaging, microbiology, ancillary and laboratory) are listed below for reference.     Microbiology: Recent Results (from the past 240 hour(s))  Blood Culture (routine x 2)     Status: None (Preliminary result)   Collection Time: 09/05/22  8:57 PM   Specimen: BLOOD LEFT ARM  Result Value Ref Range Status   Specimen Description BLOOD LEFT ARM  Final   Special Requests   Final    BOTTLES DRAWN AEROBIC ONLY Blood Culture adequate volume   Culture   Final    NO GROWTH 3 DAYS Performed at [facility], [address] [place], [place] [address]    Report Status PENDING  Incomplete  Blood Culture (routine x 2)     Status: None (Preliminary result)   Collection Time: 09/05/22  8:57 PM   Specimen: BLOOD RIGHT ARM  Result Value Ref Range Status   Specimen Description BLOOD RIGHT ARM  Final   Special Requests   Final    BOTTLES DRAWN AEROBIC AND ANAEROBIC Blood Culture adequate volume   Culture   Final  NO GROWTH 3 DAYS Performed at [facility], [place] [address], [place], [place] [address]    Report Status PENDING  Incomplete     Labs: BNP (last 3 results) No results for input(s): "BNP" in the last 8760 hours. Basic Metabolic Panel: Recent Labs  Lab 09/05/22 2057 09/06/22 0300 09/06/22 0756 09/07/22 0257 09/08/22 0300  NA 138  --  139 135 137  K 3.4*   --  3.8 3.4* 3.8  CL 103  --  105 103 104  CO2 24  --  25 22 23   GLUCOSE 98  --  101* 79 81  BUN 7  --  7 9 8   CREATININE 1.14 1.18 1.11 1.17 1.12  CALCIUM 9.5  --  9.0 8.6* 8.8*  MG  --   --  1.9 1.9 2.0  PHOS  --   --  3.8  --   --    Liver Function Tests: Recent Labs  Lab 09/05/22 2057 09/06/22 0756  AST 29 25  ALT 38 31  ALKPHOS 48 39  BILITOT 1.2 1.3*  PROT 7.8 7.0  ALBUMIN 4.1 3.5   Recent Labs  Lab 09/05/22 2057  LIPASE 38   No results for input(s): "AMMONIA" in the last 168 hours. CBC: Recent Labs  Lab 09/05/22 2057 09/06/22 0300 09/07/22 0257 09/08/22 0300  WBC 14.1* 11.4* 8.2 5.1  NEUTROABS 11.6*  --  5.9 3.4  HGB 16.7 15.3 14.4 14.7  HCT 49.6 47.1 41.7 42.2  MCV 92.4 94.8 91.6 91.5  PLT 207 183 192 204   Cardiac Enzymes: No results for input(s): "CKTOTAL", "CKMB", "CKMBINDEX", "TROPONINI" in the last 168 hours. BNP: Invalid input(s): "POCBNP" CBG: No results for input(s): "GLUCAP" in the last 168 hours. D-Dimer No results for input(s): "DDIMER" in the last 72 hours. Hgb A1c No results for input(s): "HGBA1C" in the last 72 hours. Lipid Profile No results for input(s): "CHOL", "HDL", "LDLCALC", "TRIG", "CHOLHDL", "LDLDIRECT" in the last 72 hours. Thyroid function studies No results for input(s): "TSH", "T4TOTAL", "T3FREE", "THYROIDAB" in the last 72 hours.  Invalid input(s): "FREET3" Anemia work up No results for input(s): "VITAMINB12", "FOLATE", "FERRITIN", "TIBC", "IRON", "RETICCTPCT" in the last 72 hours. Urinalysis    Component Value Date/Time   COLORURINE YELLOW 09/05/2022 2105   APPEARANCEUR CLEAR 09/05/2022 2105   LABSPEC 1.012 09/05/2022 2105   PHURINE 6.0 09/05/2022 2105   GLUCOSEU NEGATIVE 09/05/2022 2105   HGBUR SMALL (A) 09/05/2022 2105   BILIRUBINUR NEGATIVE 09/05/2022 2105   KETONESUR 5 (A) 09/05/2022 2105   PROTEINUR NEGATIVE 09/05/2022 2105   NITRITE NEGATIVE 09/05/2022 2105   LEUKOCYTESUR NEGATIVE 09/05/2022 2105    Sepsis Labs Recent Labs  Lab 09/05/22 2057 09/06/22 0300 09/07/22 0257 09/08/22 0300  WBC 14.1* 11.4* 8.2 5.1   Microbiology Recent Results (from the past 240 hour(s))  Blood Culture (routine x 2)     Status: None (Preliminary result)   Collection Time: 09/05/22  8:57 PM   Specimen: BLOOD LEFT ARM  Result Value Ref Range Status   Specimen Description BLOOD LEFT ARM  Final   Special Requests   Final    BOTTLES DRAWN AEROBIC ONLY Blood Culture adequate volume   Culture   Final    NO GROWTH 3 DAYS Performed at [facility] [address] [place], [place] [address]    Report Status PENDING  Incomplete  Blood Culture (routine x 2)     Status: None (Preliminary result)   Collection Time:  09/05/22  8:57 PM   Specimen: BLOOD RIGHT ARM  Result Value Ref Range Status   Specimen Description BLOOD RIGHT ARM  Final   Special Requests   Final    BOTTLES DRAWN AEROBIC AND ANAEROBIC Blood Culture adequate volume   Culture   Final    NO GROWTH 3 DAYS Performed at [facility], [address], [place], [place] [address]    Report Status PENDING  Incomplete     Time coordinating discharge: 35 minutes  SIGNED:   Aline August, MD  [facility] 09/08/2022, 8:10 AM

## 2022-09-08 NOTE — Progress Notes (Signed)
Discharge instructions reviewed with pt and family member at bedside. Questions answered.  Copy of instructions given to pt. Handout provided on diverticulitis and discussed diet. Pt informed scripts sent to his pharmacy for pick up.  Pt to be  d/c'd via wheelchair with belongings, with family.          To be escorted by staff/hospital volunteer.

## 2022-09-08 NOTE — Plan of Care (Signed)

## 2022-09-10 LAB — CULTURE, BLOOD (ROUTINE X 2)
Culture: NO GROWTH
Culture: NO GROWTH
Special Requests: ADEQUATE
Special Requests: ADEQUATE

## 2022-09-15 ENCOUNTER — Encounter: Payer: Self-pay | Admitting: Physician Assistant

## 2022-10-13 NOTE — Progress Notes (Unsigned)
10/14/2022 Oscar Young [id number] 02/27/1981  Referring provider: Aline August, MD Primary GI doctor: Dr. Lyndel Safe  ASSESSMENT AND PLAN:   Acute diverticulitis Has never had colonoscopy, first episode of diverticulitis, Oscar family history, Oscar AB pain on exam.  Will plan for screening colonoscopy 8-12 weeks from acute flare to evaluate.  Patient very anxious, I spent a long amount of time going over risks/benefits with the patient and let him do I medically recommend proceeding with colonoscopy.  Add on fiber supplement, avoid NSAIDS, information given  Rectal bleeding Small volume, likely hemorrhoidal, will schedule for colonoscopy to evaluate further. Add on fiber.  Patient Care Team: Patient, Oscar Young as PCP - General (General Practice)  HISTORY OF PRESENT ILLNESS: 42 y.o. male with a past medical history of SVT and others listed below presents for evaluation of diverticulitis.   09/08/2022 ER visit for abdominal pain, elevated lactic acid and fever. CT abdomen pelvis with contrast revealed acute uncomplicated sigmoid diverticulitis without perforation or abscess.  Treated with IV antibiotics, blood cultures negative.  Discharged home on Cipro Flagyl  States paternal uncle possibly with colon cancer but not [id number] certain if it was colon.  Denies any other family history of colon cancer or other gastrointestinal malignancies.  Denies family history of diverticulosis.  Had never had episode like this in the past.  Prior to this episode he had formed stools daily, denies diarrhea, constipation.  Will see BRB small volume blood on TP.  Rare rectal pain, occ itching, Oscar burning.  Denies changes in appetite, unintentional weight loss.  Patient denies GERD.  Patient denies dysphagia, nausea, vomiting, melena.   He states he has been trying to avoid red meat, nuts, popcorns and seeds since diverticulitis.  He does not eat much if any fruit but does eat, admits to not eating much  fiber.   He  reports that he has never smoked. He does not have any smokeless tobacco history on file. He reports current alcohol use. He reports that he does not use drugs.  RELEVANT LABS AND IMAGING: CBC    Component Value Date/Time   WBC 5.1 09/08/2022 0300   RBC 4.61 09/08/2022 0300   HGB 14.7 09/08/2022 0300   HCT 42.2 09/08/2022 0300   PLT 204 09/08/2022 0300   MCV 91.5 09/08/2022 0300   MCH 31.9 09/08/2022 0300   MCHC 34.8 09/08/2022 0300   RDW 13.5 09/08/2022 0300   LYMPHSABS 1.2 09/08/2022 0300   MONOABS 0.4 09/08/2022 0300   EOSABS 0.0 09/08/2022 0300   BASOSABS 0.0 09/08/2022 0300   Recent Labs    09/05/22 2057 09/06/22 0300 09/07/22 0257 09/08/22 0300  HGB 16.7 15.3 14.4 14.7     CMP     Component Value Date/Time   NA 137 09/08/2022 0300   K 3.8 09/08/2022 0300   CL 104 09/08/2022 0300   CO2 23 09/08/2022 0300   GLUCOSE 81 09/08/2022 0300   BUN 8 09/08/2022 0300   CREATININE 1.12 09/08/2022 0300   CALCIUM 8.8 (L) 09/08/2022 0300   PROT 7.0 09/06/2022 0756   ALBUMIN 3.5 09/06/2022 0756   AST 25 09/06/2022 0756   ALT 31 09/06/2022 0756   ALKPHOS 39 09/06/2022 0756   BILITOT 1.3 (H) 09/06/2022 0756   GFRNONAA >60 09/08/2022 0300   GFRAA >60 12/13/2015 1828      Latest Ref Rng & Units 09/06/2022    7:56 AM 09/05/2022    8:57 PM 09/07/2021    7:40  PM  Hepatic Function  Total Protein 6.5 - 8.1 g/dL 7.0  7.8  7.5   Albumin 3.5 - 5.0 g/dL 3.5  4.1  4.3   AST 15 - 41 U/L '25  29  22   '$ ALT 0 - 44 U/L 31  38  24   Alk Phosphatase 38 - 126 U/L 39  48  53   Total Bilirubin 0.3 - 1.2 mg/dL 1.3  1.2  0.7       Current Medications:    Current Outpatient Medications (Cardiovascular):    metoprolol succinate (TOPROL-XL) 25 MG 24 hr tablet, TAKE 1 TABLET(25 MG) BY MOUTH DAILY WITH OR IMMEDIATELY FOLLOWING A MEAL (Patient taking differently: Take 25 mg by mouth daily.)      Medical History:  Past Medical History:  Diagnosis Date   Anxiety     Depression    Diverticulitis    SVT (supraventricular tachycardia)    Allergies: Oscar Known Allergies   Surgical History:  He  has Oscar past surgical history on file. Family History:  His family history includes Diabetes in his mother.  REVIEW OF SYSTEMS  : All other systems reviewed and negative except where noted in the History of Present Illness.  PHYSICAL EXAM: BP 100/70   Pulse 68   Ht '5\' 7"'$  (1.702 m)   Wt 205 lb (93 kg)   BMI 32.11 kg/m  General Appearance: Well nourished, in Oscar apparent distress. Head:   Normocephalic and atraumatic. Eyes:  sclerae anicteric,conjunctive pink  Respiratory: Respiratory effort normal, BS equal bilaterally without rales, rhonchi, wheezing. Cardio: RRR with Oscar MRGs. Peripheral pulses intact.  Abdomen: Soft,  Obese ,active bowel sounds. Oscar tenderness . Without guarding and Without rebound. Oscar masses. Rectal: Not evaluated Musculoskeletal: Full ROM, Normal gait. Without edema. Skin:  Dry and intact without significant lesions or rashes Neuro: Alert and  oriented x4;  Oscar focal deficits. Psych:  Cooperative. Normal mood and affect.    Oscar Crofts, PA-C 9:45 AM

## 2022-10-14 ENCOUNTER — Encounter: Payer: Self-pay | Admitting: Physician Assistant

## 2022-10-14 ENCOUNTER — Ambulatory Visit: Payer: BC Managed Care – PPO | Admitting: Physician Assistant

## 2022-10-14 VITALS — BP 100/70 | HR 68 | Ht 67.0 in | Wt 205.0 lb

## 2022-10-14 DIAGNOSIS — K625 Hemorrhage of anus and rectum: Secondary | ICD-10-CM

## 2022-10-14 DIAGNOSIS — K5792 Diverticulitis of intestine, part unspecified, without perforation or abscess without bleeding: Secondary | ICD-10-CM | POA: Diagnosis not present

## 2022-10-14 MED ORDER — NA SULFATE-K SULFATE-MG SULF 17.5-3.13-1.6 GM/177ML PO SOLN: 1.0000 | Freq: Once | ORAL | 0 refills | Status: AC

## 2022-10-14 NOTE — Patient Instructions (Addendum)
Diverticulosis Diverticulosis is a condition that develops when small pouches (diverticula) form in the wall of the large intestine (colon). The colon is where water is absorbed and stool (feces) is formed. The pouches form when the inside layer of the colon pushes through weak spots in the outer layers of the colon. You may have a few pouches or many of them. The pouches usually do not cause problems unless they become inflamed or infected. When this happens, the condition is called diverticulitis- this is left lower quadrant pain, diarrhea, fever, chills, nausea or vomiting.  If this occurs please call the office or go to the hospital. Sometimes these patches without inflammation can also have painless bleeding associated with them, if this happens please call the office or go to the hospital. Preventing constipation and increasing fiber can help reduce diverticula and prevent complications. Even if you feel you have a high-fiber diet, suggest getting on Benefiber or Cirtracel 2 times daily.  Due to recent changes in healthcare laws, you may see the results of your imaging and laboratory studies on MyChart before your provider has had a chance to review them.  We understand that in some cases there may be results that are confusing or concerning to you. Not all laboratory results come back in the same time frame and the provider may be waiting for multiple results in order to interpret others.  Please give [place] 48 hours in order for your provider to thoroughly review all the results before contacting the office for clarification of your results.    You have been scheduled for a colonoscopy. Please follow written instructions given to you at your visit today.  Please pick up your prep supplies at the pharmacy within the next 1-3 days. If you use inhalers (even only as needed), please bring them with you on the day of your procedure.   I appreciate the  opportunity to care for you  Thank You   [facility]

## 2022-10-15 ENCOUNTER — Other Ambulatory Visit: Payer: Self-pay

## 2022-10-15 MED ORDER — METOPROLOL SUCCINATE ER 25 MG PO TB24: 25.0000 mg | ORAL_TABLET | Freq: Every day | ORAL | 3 refills | Status: DC

## 2022-10-17 ENCOUNTER — Other Ambulatory Visit: Payer: Self-pay

## 2022-10-17 MED ORDER — METOPROLOL SUCCINATE ER 25 MG PO TB24: 25.0000 mg | ORAL_TABLET | Freq: Every day | ORAL | 3 refills | Status: DC

## 2022-10-18 NOTE — Progress Notes (Signed)
Agree with assessment/plan.  Raj Maiyah Goyne, MD [facility] [phone]  

## 2022-11-28 ENCOUNTER — Telehealth: Payer: Self-pay | Admitting: Gastroenterology

## 2022-11-28 ENCOUNTER — Encounter: Payer: Self-pay | Admitting: Gastroenterology

## 2022-11-28 ENCOUNTER — Ambulatory Visit (AMBULATORY_SURGERY_CENTER): Payer: BC Managed Care – PPO | Admitting: Gastroenterology

## 2022-11-28 VITALS — BP 113/71 | HR 84 | Temp 98.2°F | Resp 14 | Ht 67.0 in | Wt 205.0 lb

## 2022-11-28 DIAGNOSIS — Z800 Family history of malignant neoplasm of digestive organs: Secondary | ICD-10-CM

## 2022-11-28 DIAGNOSIS — Z1211 Encounter for screening for malignant neoplasm of colon: Secondary | ICD-10-CM | POA: Diagnosis not present

## 2022-11-28 DIAGNOSIS — K5792 Diverticulitis of intestine, part unspecified, without perforation or abscess without bleeding: Secondary | ICD-10-CM

## 2022-11-28 MED ORDER — SODIUM CHLORIDE 0.9 % IV SOLN: 500.0000 mL | Freq: Once | INTRAVENOUS | Status: DC

## 2022-11-28 MED ORDER — ALBUTEROL SULFATE (2.5 MG/3ML) 0.083% IN NEBU: 2.5000 mg | INHALATION_SOLUTION | Freq: Once | RESPIRATORY_TRACT | Status: DC

## 2022-11-28 NOTE — Progress Notes (Signed)
1018 Albuterol neb given due to sat down to 88 to 89 % on RA, pt states he has had a cold for the past couple of days.

## 2022-11-28 NOTE — Op Note (Signed)
[facility] Patient Name: Oscar Young Procedure Date: 11/28/2022 9:45 AM MRN: [id number] Endoscopist: Lynann Bologna , MD, [id number] Age: 42 Referring MD:  Date of Birth: Sep 02, 1980 Gender: Male Account #: [id number] Procedure:                Colonoscopy Indications:              1. Screening for CRC -FH CRC in second degree                            relative. 2. H/O sig diverticulitis Jan 2024 Medicines:                Monitored Anesthesia Care Procedure:                Pre-Anesthesia Assessment:                           - Prior to the procedure, a History and Physical                            was performed, and patient medications and                            allergies were reviewed. The patient's tolerance of                            previous anesthesia was also reviewed. The risks                            and benefits of the procedure and the sedation                            options and risks were discussed with the patient.                            All questions were answered, and informed consent                            was obtained. Prior Anticoagulants: The patient has                            taken no anticoagulant or antiplatelet agents. ASA                            Grade Assessment: II - A patient with mild systemic                            disease. After reviewing the risks and benefits,                            the patient was deemed in satisfactory condition to                            undergo the procedure.  After obtaining informed consent, the colonoscope                            was passed under direct vision. Throughout the                            procedure, the patient's blood pressure, pulse, and                            oxygen saturations were monitored continuously. The                            CF [id number] #[id number] was introduced through the anus                            and advanced to the 2 cm  into the ileum. The                            colonoscopy was performed without difficulty. The                            patient tolerated the procedure well. The quality                            of the bowel preparation was good. The terminal                            ileum, ileocecal valve, appendiceal orifice, and                            rectum were photographed. Scope In: 9:49:35 AM Scope Out: 9:59:00 AM Scope Withdrawal Time: 0 hours 6 minutes 44 seconds  Total Procedure Duration: 0 hours 9 minutes 25 seconds  Findings:                 Multiple small-mouthed diverticula were found in                            the sigmoid colon and descending colon, up to 45 cm                            from the anal verge on withdrawal of the scope. No                            significant diverticula in the right side of the                            colon. No endoscopic evidence of diverticulitis.                           Non-bleeding internal hemorrhoids were found during                            retroflexion. The hemorrhoids  were small and Grade                            I (internal hemorrhoids that do not prolapse).                           The terminal ileum appeared normal.                           The exam was otherwise without abnormality on                            direct and retroflexion views. Complications:            No immediate complications. Did have laryngo-spasm,                            supported by suctioning, supplemental O2 Estimated Blood Loss:     Estimated blood loss: none. Impression:               - Moderate left colonic diverticulosis                           - Non-bleeding internal hemorrhoids.                           - The examined portion of the ileum was normal.                           - The examination was otherwise normal on direct                            and retroflexion views.                           - No specimens  collected. Recommendation:           - Patient has a contact number available for                            emergencies. The signs and symptoms of potential                            delayed complications were discussed with the                            patient. Return to normal activities tomorrow.                            Written discharge instructions were provided to the                            patient.                           - High fiber diet.                           -  Continue present medications.                           - Repeat colonoscopy in 10 years for screening                            purposes. Earlier, if with any new problems or                            change in family history.                           - The findings and recommendations were discussed                            with the patient's family. Lynann Bologna, MD 11/28/2022 10:12:25 AM This report has been signed electronically.

## 2022-11-28 NOTE — Telephone Encounter (Signed)
PT has colonoscopy today with arrival at 9am. He is stuck in traffic because of an accident. I advised him of the 15 minute late time.

## 2022-11-28 NOTE — Progress Notes (Signed)
1002 HR > 100 with esmolol 25 mg given IV, MD updated, vss

## 2022-11-28 NOTE — Progress Notes (Signed)
10/14/2022 Oscar Young [id number] 05/15/1981   Referring provider: Glade Lloyd, MD Primary GI doctor: Dr. Chales Abrahams   ASSESSMENT AND PLAN:    Acute diverticulitis Has never had colonoscopy, first episode of diverticulitis, no family history, no AB pain on exam.  Will plan for screening colonoscopy 8-12 weeks from acute flare to evaluate.  Patient very anxious, I spent a long amount of time going over risks/benefits with the patient and let him do I medically recommend proceeding with colonoscopy.  Add on fiber supplement, avoid NSAIDS, information given   Rectal bleeding Small volume, likely hemorrhoidal, will schedule for colonoscopy to evaluate further. Add on fiber.   Patient Care Team: Patient, No Pcp Per as PCP - General (General Practice)   HISTORY OF PRESENT ILLNESS: 42 y.o. male with a past medical history of SVT and others listed below presents for evaluation of diverticulitis.    09/08/2022 ER visit for abdominal pain, elevated lactic acid and fever. CT abdomen pelvis with contrast revealed acute uncomplicated sigmoid diverticulitis without perforation or abscess.  Treated with IV antibiotics, blood cultures negative.  Discharged home on Cipro Flagyl   States paternal uncle possibly with colon cancer but not 100% certain if it was colon.  Denies any other family history of colon cancer or other gastrointestinal malignancies.  Denies family history of diverticulosis.  Had never had episode like this in the past.  Prior to this episode he had formed stools daily, denies diarrhea, constipation.  Will see BRB small volume blood on TP.  Rare rectal pain, occ itching, no burning.  Denies changes in appetite, unintentional weight loss.  Patient denies GERD.  Patient denies dysphagia, nausea, vomiting, melena.    He states he has been trying to avoid red meat, nuts, popcorns and seeds since diverticulitis.  He does not eat much if any fruit but does eat, admits to not  eating much fiber.    He  reports that he has never smoked. He does not have any smokeless tobacco history on file. He reports current alcohol use. He reports that he does not use drugs.   RELEVANT LABS AND IMAGING: CBC Labs (Brief)          Component Value Date/Time    WBC 5.1 09/08/2022 0300    RBC 4.61 09/08/2022 0300    HGB 14.7 09/08/2022 0300    HCT 42.2 09/08/2022 0300    PLT 204 09/08/2022 0300    MCV 91.5 09/08/2022 0300    MCH 31.9 09/08/2022 0300    MCHC 34.8 09/08/2022 0300    RDW 13.5 09/08/2022 0300    LYMPHSABS 1.2 09/08/2022 0300    MONOABS 0.4 09/08/2022 0300    EOSABS 0.0 09/08/2022 0300    BASOSABS 0.0 09/08/2022 0300      Recent Labs (within last 365 days)        Recent Labs    09/05/22 2057 09/06/22 0300 09/07/22 0257 09/08/22 0300  HGB 16.7 15.3 14.4 14.7          CMP     Labs (Brief)          Component Value Date/Time    NA 137 09/08/2022 0300    K 3.8 09/08/2022 0300    CL 104 09/08/2022 0300    CO2 23 09/08/2022 0300    GLUCOSE 81 09/08/2022 0300    BUN 8 09/08/2022 0300    CREATININE 1.12 09/08/2022 0300    CALCIUM 8.8 (L) 09/08/2022 0300    PROT  7.0 09/06/2022 0756    ALBUMIN 3.5 09/06/2022 0756    AST 25 09/06/2022 0756    ALT 31 09/06/2022 0756    ALKPHOS 39 09/06/2022 0756    BILITOT 1.3 (H) 09/06/2022 0756    GFRNONAA >60 09/08/2022 0300    GFRAA >60 12/13/2015 1828          Latest Ref Rng & Units 09/06/2022    7:56 AM 09/05/2022    8:57 PM 09/07/2021    7:40 PM  Hepatic Function  Total Protein 6.5 - 8.1 g/dL 7.0  7.8  7.5   Albumin 3.5 - 5.0 g/dL 3.5  4.1  4.3   AST 15 - 41 U/L ALT 0 - 44 U/L 31  38  24   Alk Phosphatase 38 - 126 U/L 39  48  53   Total Bilirubin 0.3 - 1.2 mg/dL 1.3  1.2  0.7       Current Medications:      Current Outpatient Medications (Cardiovascular):    metoprolol succinate (TOPROL-XL) 25 MG 24 hr tablet, TAKE 1 TABLET(25 MG) BY MOUTH DAILY WITH OR IMMEDIATELY FOLLOWING A MEAL  (Patient taking differently: Take 25 mg by mouth daily.)           Medical History:      Past Medical History:  Diagnosis Date   Anxiety     Depression     Diverticulitis     SVT (supraventricular tachycardia)      Allergies: No Known Allergies    Surgical History:  He  has no past surgical history on file. Family History:  His family history includes Diabetes in his mother.   REVIEW OF SYSTEMS  : All other systems reviewed and negative except where noted in the History of Present Illness.   PHYSICAL EXAM: BP 100/70   Pulse 68   Ht  (1.702 m)   Wt 205 lb (93 kg)   BMI 32.11 kg/m  General Appearance: Well nourished, in no apparent distress. Head:   Normocephalic and atraumatic. Eyes:  sclerae anicteric,conjunctive pink  Respiratory: Respiratory effort normal, BS equal bilaterally without rales, rhonchi, wheezing. Cardio: RRR with no MRGs. Peripheral pulses intact.  Abdomen: Soft,  Obese ,active bowel sounds. No tenderness . Without guarding and Without rebound. No masses. Rectal: Not evaluated Musculoskeletal: Full ROM, Normal gait. Without edema. Skin:  Dry and intact without significant lesions or rashes Neuro: Alert and  oriented x4;  No focal deficits. Psych:  Cooperative. Normal mood and affect.      [name], PA-C 9:45 AM    Attending physician's note   I have taken history, reviewed the chart and examined the patient. I performed a substantive portion of this encounter, including complete performance of at least one of the key components, in conjunction with the APP. I agree with the Advanced Practitioner's note, impression and recommendations.   Colon today   Edman Circle, MD [facility] [phone]

## 2022-11-28 NOTE — Patient Instructions (Signed)
Handout on diverticulosis and high fiber given.     YOU HAD AN ENDOSCOPIC PROCEDURE TODAY AT THE [facility]:   Refer to the procedure report that was given to you for any specific questions about what was found during the examination.  If the procedure report does not answer your questions, please call your gastroenterologist to clarify.  If you requested that your care partner not be given the details of your procedure findings, then the procedure report has been included in a sealed envelope for you to review at your convenience later.  YOU SHOULD EXPECT: Some feelings of bloating in the abdomen. Passage of more gas than usual.  Walking can help get rid of the air that was put into your GI tract during the procedure and reduce the bloating. If you had a lower endoscopy (such as a colonoscopy or flexible sigmoidoscopy) you may notice spotting of blood in your stool or on the toilet paper. If you underwent a bowel prep for your procedure, you may not have a normal bowel movement for a few days.  Please Note:  You might notice some irritation and congestion in your nose or some drainage.  This is from the oxygen used during your procedure.  There is no need for concern and it should clear up in a day or so.  SYMPTOMS TO REPORT IMMEDIATELY:  Following lower endoscopy (colonoscopy or flexible sigmoidoscopy):  Excessive amounts of blood in the stool  Significant tenderness or worsening of abdominal pains  Swelling of the abdomen that is new, acute  Fever of 100F or higher   For urgent or emergent issues, a gastroenterologist can be reached at any hour by calling [phone]. Do not use MyChart messaging for urgent concerns.    DIET:  We do recommend a small meal at first, but then you may proceed to your regular diet.  Drink plenty of fluids but you should avoid alcoholic beverages for 24 hours.  ACTIVITY:  You should plan to take it easy for the rest of today and you should  NOT DRIVE or use heavy machinery until tomorrow (because of the sedation medicines used during the test).    FOLLOW UP: Our staff will call the number listed on your records the next business day following your procedure.  We will call around 7:15- 8:00 am to check on you and address any questions or concerns that you may have regarding the information given to you following your procedure. If we do not reach you, we will leave a message.     If any biopsies were taken you will be contacted by phone or by letter within the next 1-3 weeks.  Please call us at [phone] if you have not heard about the biopsies in 3 weeks.    SIGNATURES/CONFIDENTIALITY: You and/or your care partner have signed paperwork which will be entered into your electronic medical record.  These signatures attest to the fact that that the information above on your After Visit Summary has been reviewed and is understood.  Full responsibility of the confidentiality of this discharge information lies with you and/or your care-partner.

## 2022-11-28 NOTE — Progress Notes (Signed)
VS by EC  Pt's states no medical or surgical changes since previsit or office visit.  

## 2022-11-28 NOTE — Progress Notes (Signed)
1013 Report given to PACU, vss

## 2022-11-28 NOTE — Progress Notes (Signed)
0955  Pt experienced laryngeal spasm with jaw thrust performed.  HR > 100 with esmolol 25 mg given IV, MD updated, vss

## 2022-11-28 NOTE — Progress Notes (Signed)
1000  Pt experienced laryngeal spasm with jaw thrust and PPV performed. vss

## 2022-11-28 NOTE — Progress Notes (Signed)
1025 pt awake and following commands, vss

## 2022-12-01 ENCOUNTER — Telehealth: Payer: Self-pay | Admitting: *Deleted

## 2022-12-01 NOTE — Telephone Encounter (Signed)
Left message on f/u call 

## 2022-12-25 ENCOUNTER — Ambulatory Visit: Payer: BC Managed Care – PPO | Admitting: Cardiology

## 2022-12-25 ENCOUNTER — Ambulatory Visit: Payer: BC Managed Care – PPO | Admitting: Student

## 2022-12-25 ENCOUNTER — Encounter: Payer: Self-pay | Admitting: Cardiology

## 2022-12-25 VITALS — BP 123/86 | HR 67 | Resp 16 | Ht 68.0 in | Wt 203.0 lb

## 2022-12-25 DIAGNOSIS — K5730 Diverticulosis of large intestine without perforation or abscess without bleeding: Secondary | ICD-10-CM

## 2022-12-25 DIAGNOSIS — R030 Elevated blood-pressure reading, without diagnosis of hypertension: Secondary | ICD-10-CM

## 2022-12-25 DIAGNOSIS — Z7689 Persons encountering health services in other specified circumstances: Secondary | ICD-10-CM | POA: Diagnosis not present

## 2022-12-25 DIAGNOSIS — R002 Palpitations: Secondary | ICD-10-CM | POA: Diagnosis not present

## 2022-12-25 NOTE — Progress Notes (Signed)
Primary Physician/Referring:  Patient, No Pcp Per  Patient ID: Oscar Young, male    DOB: 1980/08/23, 42 y.o.   MRN: [id number]  Chief Complaint  Patient presents with   Palpitations   Follow-up    1 yr    HPI:    Kirtus Sazama  is a 42 y.o. African-American male patient with no significant prior cardiovascular history.  He has been evaluated in our office on multiple occasions since 2017 for palpitations.  Patient has known PACs and PVCs on cardiac monitor, previously also has had mildly elevated blood pressure, as he had done well on metoprolol succinate 25 mg was added.  He is presently asymptomatic and has not had any further evaluation for palpitations or ED visit for palpitation.  He was admitted January 2024 with acute diverticulosis of the colon.  Past Medical History:  Diagnosis Date   Anxiety    Depression    Diverticulitis    SVT (supraventricular tachycardia)    History reviewed. No pertinent surgical history. Family History  Problem Relation Age of Onset   Diabetes Mother    Colon cancer Paternal Uncle    Esophageal cancer Neg Hx    Rectal cancer Neg Hx    Stomach cancer Neg Hx     Social History   Tobacco Use   Smoking status: Never   Smokeless tobacco: Not on file  Substance Use Topics   Alcohol use: Yes    Comment: OCC   Marital Status: Single  ROS  Review of Systems  Cardiovascular:  Negative for chest pain, dyspnea on exertion and leg swelling.   Objective  Blood pressure 123/86, pulse 67, resp. rate 16, height 5\' 8"  (1.727 m), weight 203 lb (92.1 kg), SpO2 97 %. Body mass index is 30.87 kg/m.     12/25/2022   10:56 AM 11/28/2022   10:34 AM 11/28/2022   10:24 AM  Vitals with BMI  Height 5\' 8"     Weight 203 lbs    BMI 30.87    Systolic 123 113 563  Diastolic 86 71 78  Pulse 67 84 87     Physical Exam Neck:     Vascular: No carotid bruit or JVD.  Cardiovascular:     Rate and Rhythm: Normal rate and regular rhythm.     Pulses: Intact distal  pulses.     Heart sounds: Normal heart sounds. No murmur heard.    No gallop.  Pulmonary:     Effort: Pulmonary effort is normal.     Breath sounds: Normal breath sounds.  Abdominal:     General: Bowel sounds are normal.     Palpations: Abdomen is soft.  Musculoskeletal:     Right lower leg: No edema.     Left lower leg: No edema.     Laboratory examination:   Recent Labs    09/06/22 0756 09/07/22 0257 09/08/22 0300  NA 139 135 137  K 3.8 3.4* 3.8  CL 105 103 104  CO2 25 22 23   GLUCOSE 101* 79 81  BUN 7 9 8   CREATININE 1.11 1.17 1.12  CALCIUM 9.0 8.6* 8.8*  GFRNONAA >60 >60 >60  CrCl cannot be calculated (Patient's most recent lab result is older than the maximum 21 days allowed.).     Latest Ref Rng & Units 09/08/2022    3:00 AM 09/07/2022    2:57 AM 09/06/2022    7:56 AM  CMP  Glucose 70 - 99 mg/dL 81  79  875  BUN 6 - 20 mg/dL 8  9  7    Creatinine 0.61 - 1.24 mg/dL 1.61  0.96  0.45   Sodium 135 - 145 mmol/L 137  135  139   Potassium 3.5 - 5.1 mmol/L 3.8  3.4  3.8   Chloride 98 - 111 mmol/L 104  103  105   CO2 22 - 32 mmol/L 23  22  25    Calcium 8.9 - 10.3 mg/dL 8.8  8.6  9.0   Total Protein 6.5 - 8.1 g/dL   7.0   Total Bilirubin 0.3 - 1.2 mg/dL   1.3   Alkaline Phos 38 - 126 U/L   39   AST 15 - 41 U/L   25   ALT 0 - 44 U/L   31       Latest Ref Rng & Units 09/08/2022    3:00 AM 09/07/2022    2:57 AM 09/06/2022    3:00 AM  CBC  WBC 4.0 - 10.5 K/uL 5.1  8.2  11.4   Hemoglobin 13.0 - 17.0 g/dL 40.9  81.1  91.4   Hematocrit 39.0 - 52.0 % 42.2  41.7  47.1   Platelets 150 - 400 K/uL 204  192  183     No results found for: "CHOL", "HDL", "LDLCALC", "LDLDIRECT", "TRIG", "CHOLHDL"   Lab Results  Component Value Date   TSH 3.181 07/10/2021   External labs:  NA  Medications and allergies  No Known Allergies   Medication prior to this encounter:   Outpatient Medications Prior to Visit  Medication Sig Dispense Refill   metoprolol succinate (TOPROL-XL)  25 MG 24 hr tablet Take 1 tablet (25 mg total) by mouth daily. 90 tablet 3   albuterol (PROVENTIL) (2.5 MG/3ML) 0.083% nebulizer solution 2.5 mg      No facility-administered medications prior to visit.    Medication list after today's encounter   Current Outpatient Medications  Medication Instructions   metoprolol succinate (TOPROL-XL) 25 mg, Oral, Daily   Radiology:   No results found.  Cardiac Studies:   Event monitor 30 days 2015-10-14:  Sinus rhythm, sinus tachycardia, maximum heart rate 1 62 bpm.  No other arrhythmias.  Patient's symptoms of dizziness, lightheadedness and fatigue revealed normal sinus rhythm. No significant change from 06/24/2021.  EKG:   EKG 12/25/2022: Normal sinus rhythm with rate of 56 bpm.  Compared to 09/26/2021, no significant change.  Assessment     ICD-10-CM   1. Palpitation  R00.2 EKG 12-Lead    Ambulatory referral to Internal Medicine    2. Elevated BP without diagnosis of hypertension  R03.0     3. Establishing care with new doctor, encounter for  Z76.89 Ambulatory referral to Internal Medicine    4. Sigmoid diverticulosis  K57.30        Medications Discontinued During This Encounter  Medication Reason   albuterol (PROVENTIL) (2.5 MG/3ML) 0.083% nebulizer solution 2.5 mg      No orders of the defined types were placed in this encounter.   Orders Placed This Encounter  Procedures   Ambulatory referral to Internal Medicine    Referral Priority:   Routine    Referral Type:   Consultation    Referral Reason:   Specialty Services Required    Referred to Provider:   Charlane Ferretti, DO    Requested Specialty:   Internal Medicine    Number of Visits Requested:   1   EKG 12-Lead    Recommendations:   Jonny Ruiz  Zysk is a 42 y.o. African-American male patient with no significant prior cardiovascular history.  He has been evaluated in our office on multiple occasions since 2017 for palpitations.   1. Palpitation Since being on Toprol,  patient has done extremely well with no recurrence of palpitations.  EKG reveals normal sinus rhythm with no evidence of ischemia.  Is tolerating low-dose metoprolol succinate 25 mg daily.  Continue the same.  - EKG 12-Lead - Ambulatory referral to Internal Medicine  2. Elevated BP without diagnosis of hypertension Patient's blood pressure is elevated today, I rechecked the blood pressure, no change in diastolic blood pressure.  I discussed with him regarding prehypertension, weight loss of 5 to 10 pounds discussed with the patient, also avoid excess salt, avoidance of cheese fried food, pickles and, dips should be avoided.  3. Establishing care with new doctor, encounter for I will make referral for patient to establish with Drs. Skakle in view of patient not having a primary care doctor and also elevated blood pressure and to manage his primary care needs including evaluation of his lipids and PSA.   - Ambulatory referral to Internal Medicine  4. Sigmoid diverticulosis Recent admitted in January with diverticulosis of the sigmoid colon, advised him to use Metamucil on a daily basis to avoid constipation.  Otherwise stable from cardiac standpoint, I will see him back on a as needed basis.   Yates Decamp, MD, [facility] 12/25/2022, 11:28 AM Office: [phone] Fax: [phone] Pager: [phone]

## 2023-02-11 ENCOUNTER — Ambulatory Visit (HOSPITAL_COMMUNITY)
Admission: EM | Admit: 2023-02-11 | Discharge: 2023-02-11 | Disposition: A | Discharge status: Home / Self Care | Payer: BC Managed Care – PPO | Attending: Internal Medicine | Admitting: Internal Medicine

## 2023-02-11 ENCOUNTER — Encounter (HOSPITAL_COMMUNITY): Payer: Self-pay

## 2023-02-11 DIAGNOSIS — M7918 Myalgia, other site: Secondary | ICD-10-CM | POA: Diagnosis not present

## 2023-02-11 DIAGNOSIS — R0789 Other chest pain: Secondary | ICD-10-CM | POA: Diagnosis not present

## 2023-02-11 MED ORDER — IBUPROFEN 800 MG PO TABS: 800.0000 mg | ORAL_TABLET | Freq: Three times a day (TID) | ORAL | 0 refills | Status: AC

## 2023-02-11 MED ORDER — METHOCARBAMOL 500 MG PO TABS: 500.0000 mg | ORAL_TABLET | Freq: Two times a day (BID) | ORAL | 0 refills | Status: AC

## 2023-02-11 NOTE — Discharge Instructions (Addendum)
Your physical exam was reassuring and your EKG was normal.  Your pain appears to be musculoskeletal in nature.  You can take 800 mg of ibuprofen every 8 hours for pain and inflammation, I also suggest doing the muscle relaxer, you can take this twice daily.  Do not drink or drive on this medication as it may cause drowsiness.  You can also use heat and gentle stretching.  Please follow-up with your primary care provider next week if your pain persist despite these interventions.  Please seek immediate care for any weakness, numbness, tingling, worsening chest pain, shortness of breath, or any new concerning symptoms.

## 2023-02-11 NOTE — ED Triage Notes (Signed)
Patient here today with c/o bilat chest pain, left arm pain, and upper backache X 2 days. He takes Metoprolol daily.

## 2023-02-11 NOTE — ED Provider Notes (Signed)
[facility]    CSN: [id number] Arrival date & time: 02/11/23  1904      History   Chief Complaint Chief Complaint  Patient presents with   Chest Pain    HPI Oscar Young is a 42 y.o. male.   Patient reports bilateral upper chest pain, upper back pain and bilateral arm pain for the past two days.  He denies any recent falls or trauma.  Denies any heavy lifting.  Pain comes with arm movement, sometimes with coughing, sometimes with sitting up, and sometimes with bending and twisting.  Sometimes these actions cause the pain, sometimes they do not.  Reports pain as 'pretty uncomfortable.'   Reports compliance with metoprolol. Denies SOB, numbness, tingling or jaw pain. No fevers or recent sick contacts.   The history is provided by the patient and medical records.  Chest Pain Associated symptoms: back pain   Associated symptoms: no fever     Past Medical History:  Diagnosis Date   Anxiety    Depression    Diverticulitis    SVT (supraventricular tachycardia)     Patient Active Problem List   Diagnosis Date Noted   Acute diverticulitis 09/06/2022   Diverticulitis 09/06/2022    History reviewed. No pertinent surgical history.     Home Medications    Prior to Admission medications   Medication Sig Start Date End Date Taking? Authorizing Provider  ibuprofen (ADVIL) 800 MG tablet Take 1 tablet (800 mg total) by mouth 3 (three) times daily. 02/11/23  Yes Rinaldo Ratel, [place] N, FNP  methocarbamol (ROBAXIN) 500 MG tablet Take 1 tablet (500 mg total) by mouth 2 (two) times daily. 02/11/23  Yes Rinaldo Ratel, [place] N, FNP  metoprolol succinate (TOPROL-XL) 25 MG 24 hr tablet Take 1 tablet (25 mg total) by mouth daily. 10/17/22  Yes Yates Decamp, MD    Family History Family History  Problem Relation Age of Onset   Diabetes Mother    Colon cancer Paternal Uncle    Esophageal cancer Neg Hx    Rectal cancer Neg Hx    Stomach cancer Neg Hx     Social History Social History    Tobacco Use   Smoking status: Never  Vaping Use   Vaping Use: Never used  Substance Use Topics   Alcohol use: Yes    Comment: OCC   Drug use: No     Allergies   Patient has no known allergies.   Review of Systems Review of Systems  Constitutional:  Negative for fever.  Cardiovascular:  Positive for chest pain.  Musculoskeletal:  Positive for arthralgias and back pain.     Physical Exam Triage Vital Signs ED Triage Vitals  Enc Vitals Group     BP 02/11/23 1937 123/82     Pulse Rate 02/11/23 1937 80     Resp 02/11/23 1937 16     Temp 02/11/23 1937 99.1 F (37.3 C)     Temp Source 02/11/23 1937 Oral     SpO2 02/11/23 1937 97 %     Weight 02/11/23 1937 200 lb (90.7 kg)     Height 02/11/23 1937 5\' 7"  (1.702 m)     Head Circumference --      Peak Flow --      Pain Score 02/11/23 1936 3     Pain Loc --      Pain Edu? --      Excl. in GC? --    No data found.  Updated Vital Signs BP  123/82 (BP Location: Right Arm)   Pulse 80   Temp 99.1 F (37.3 C) (Oral)   Resp 16   Ht 5\' 7"  (1.702 m)   Wt 200 lb (90.7 kg)   SpO2 97%   BMI 31.32 kg/m   Visual Acuity Right Eye Distance:   Left Eye Distance:   Bilateral Distance:    Right Eye Near:   Left Eye Near:    Bilateral Near:     Physical Exam Vitals and nursing note reviewed.  Constitutional:      Appearance: Normal appearance. He is well-developed.  HENT:     Head: Normocephalic and atraumatic.     Right Ear: External ear normal.     Left Ear: External ear normal.     Nose: Nose normal.     Mouth/Throat:     Mouth: Mucous membranes are moist.  Eyes:     General: No scleral icterus. Cardiovascular:     Rate and Rhythm: Normal rate and regular rhythm.     Heart sounds: Normal heart sounds. No murmur heard. Pulmonary:     Effort: Pulmonary effort is normal. No tachypnea.     Breath sounds: Normal breath sounds. No decreased breath sounds.  Chest:     Chest wall: No mass or tenderness.   Musculoskeletal:        General: Normal range of motion.     Cervical back: Normal and normal range of motion.     Thoracic back: Normal.     Lumbar back: Normal.  Skin:    General: Skin is warm and dry.  Neurological:     General: No focal deficit present.     Mental Status: He is alert and oriented to person, place, and time.  Psychiatric:        Mood and Affect: Mood normal.      UC Treatments / Results  Labs (all labs ordered are listed, but only abnormal results are displayed) Labs Reviewed - No data to display  EKG   Radiology No results found.  Procedures Procedures (including critical care time)  Medications Ordered in UC Medications - No data to display  Initial Impression / Assessment and Plan / UC Course  I have reviewed the triage vital signs and the nursing notes.  Pertinent labs & imaging results that were available during my care of the patient were reviewed by me and considered in my medical decision making (see chart for details).  Vitals and triage reviewed, patient is hemodynamically stable.  Reports upper back, chest and bilateral arm pain that is exacerbated with movement.  Areas are nontender to palpation.  EKG shows normal sinus rhythm, without ST elevation or ST depression.  Discussed that symptoms appear musculoskeletal in nature, advised anti-inflammatories and muscle relaxers.  Strict emergency room return precautions given if symptoms persist or have any changes.  Patient verbalized understanding, no questions at this time.     Final Clinical Impressions(s) / UC Diagnoses   Final diagnoses:  Chest wall pain  Musculoskeletal pain     Discharge Instructions      Your physical exam was reassuring and your EKG was normal.  Your pain appears to be musculoskeletal in nature.  You can take 800 mg of ibuprofen every 8 hours for pain and inflammation, I also suggest doing the muscle relaxer, you can take this twice daily.  Do not drink or  drive on this medication as it may cause drowsiness.  You can also use heat and gentle  stretching.  Please follow-up with your primary care provider next week if your pain persist despite these interventions.  Please seek immediate care for any weakness, numbness, tingling, worsening chest pain, shortness of breath, or any new concerning symptoms.      ED Prescriptions     Medication Sig Dispense Auth. Provider   ibuprofen (ADVIL) 800 MG tablet Take 1 tablet (800 mg total) by mouth 3 (three) times daily. 21 tablet Rinaldo Ratel, [place] N, [place]   methocarbamol (ROBAXIN) 500 MG tablet Take 1 tablet (500 mg total) by mouth 2 (two) times daily. 20 tablet Remi Rester, [place], [place]      PDMP not reviewed this encounter.   Deysi Soldo, [place] N, [place] 02/11/23 2009

## 2023-02-13 IMAGING — CR DG CHEST 2V
2 series · 2 of 2 positions shown · non-contrast
Comparison: 12/13/2015

CLINICAL DATA: Cough

EXAM:
CHEST - 2 VIEW

[w chest pa]
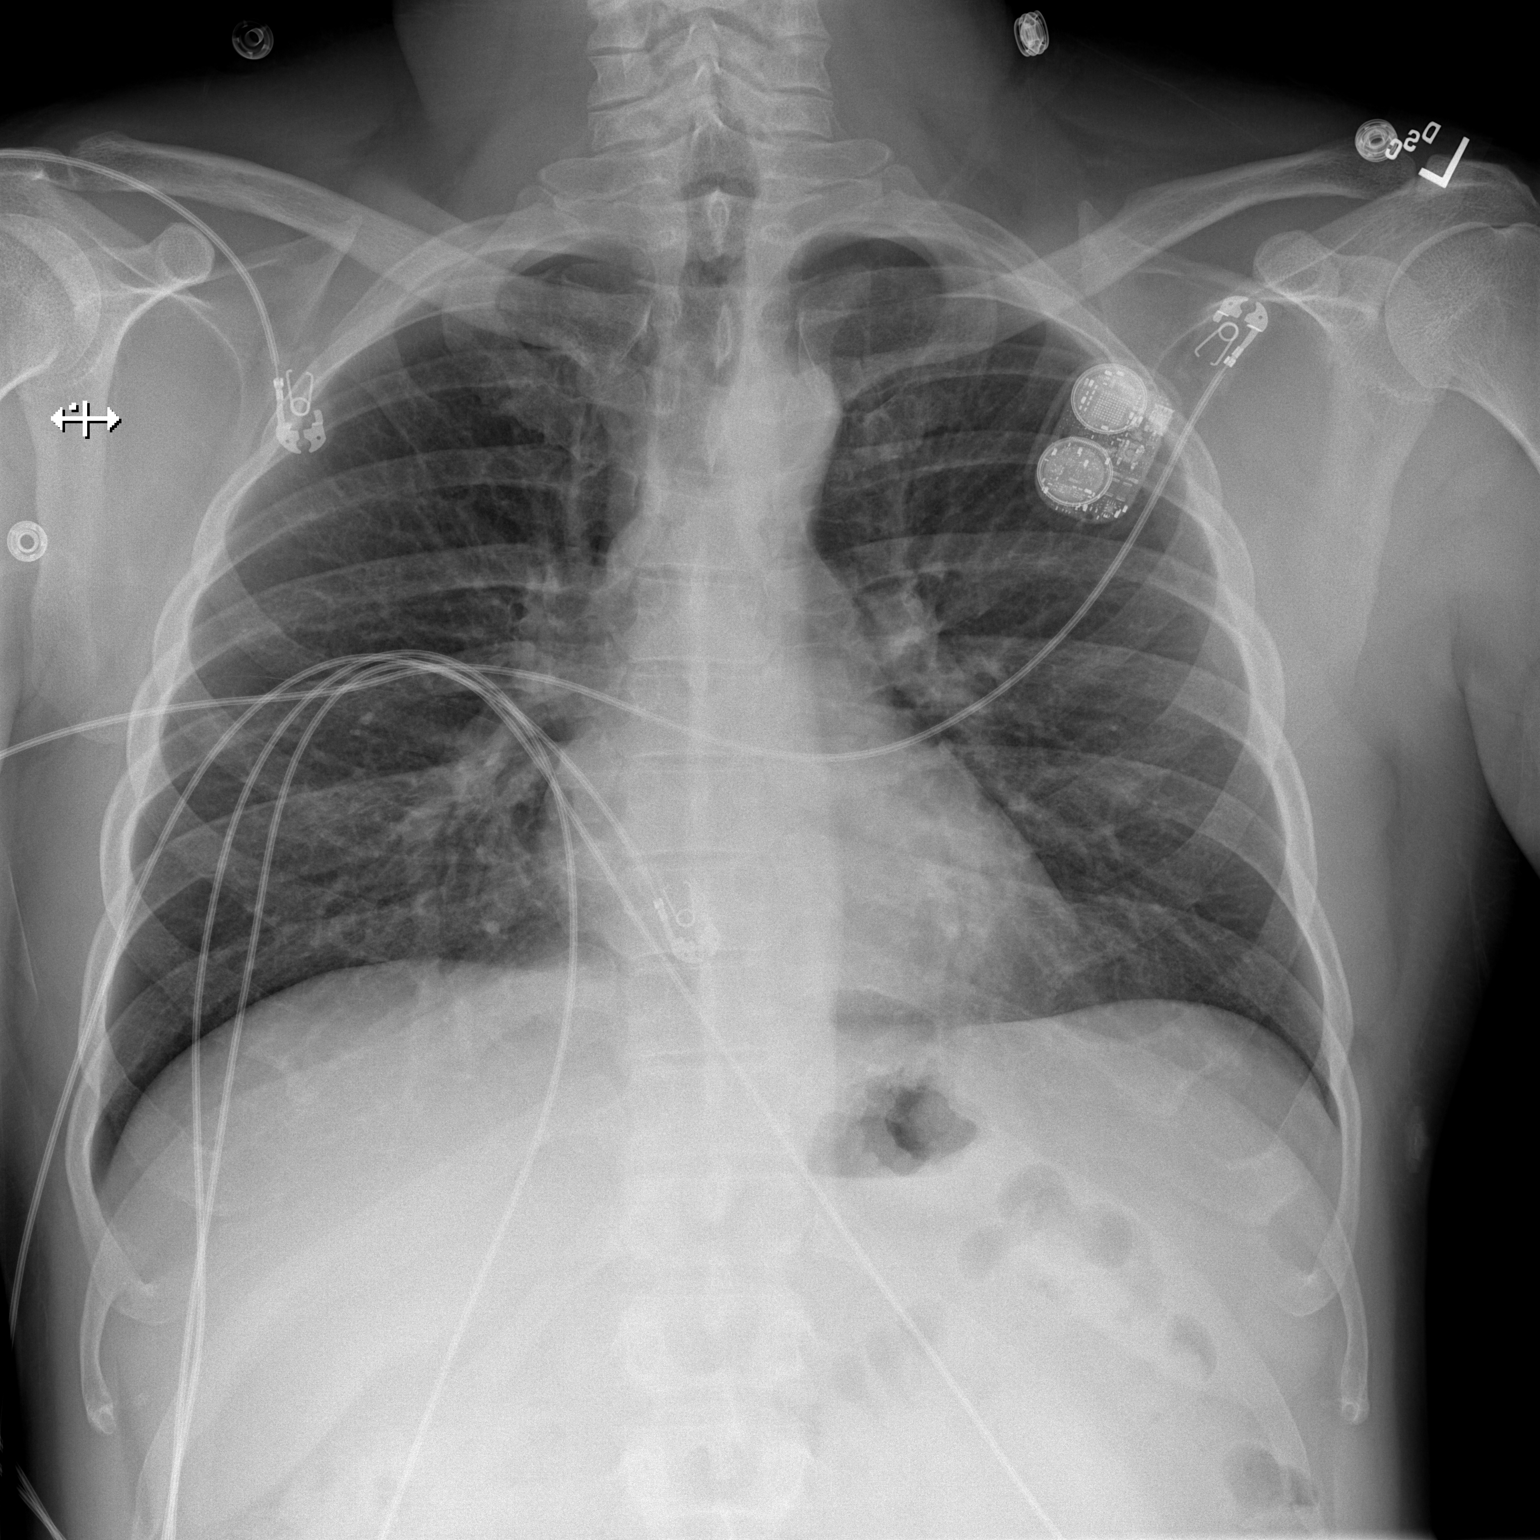

[w chest lat]
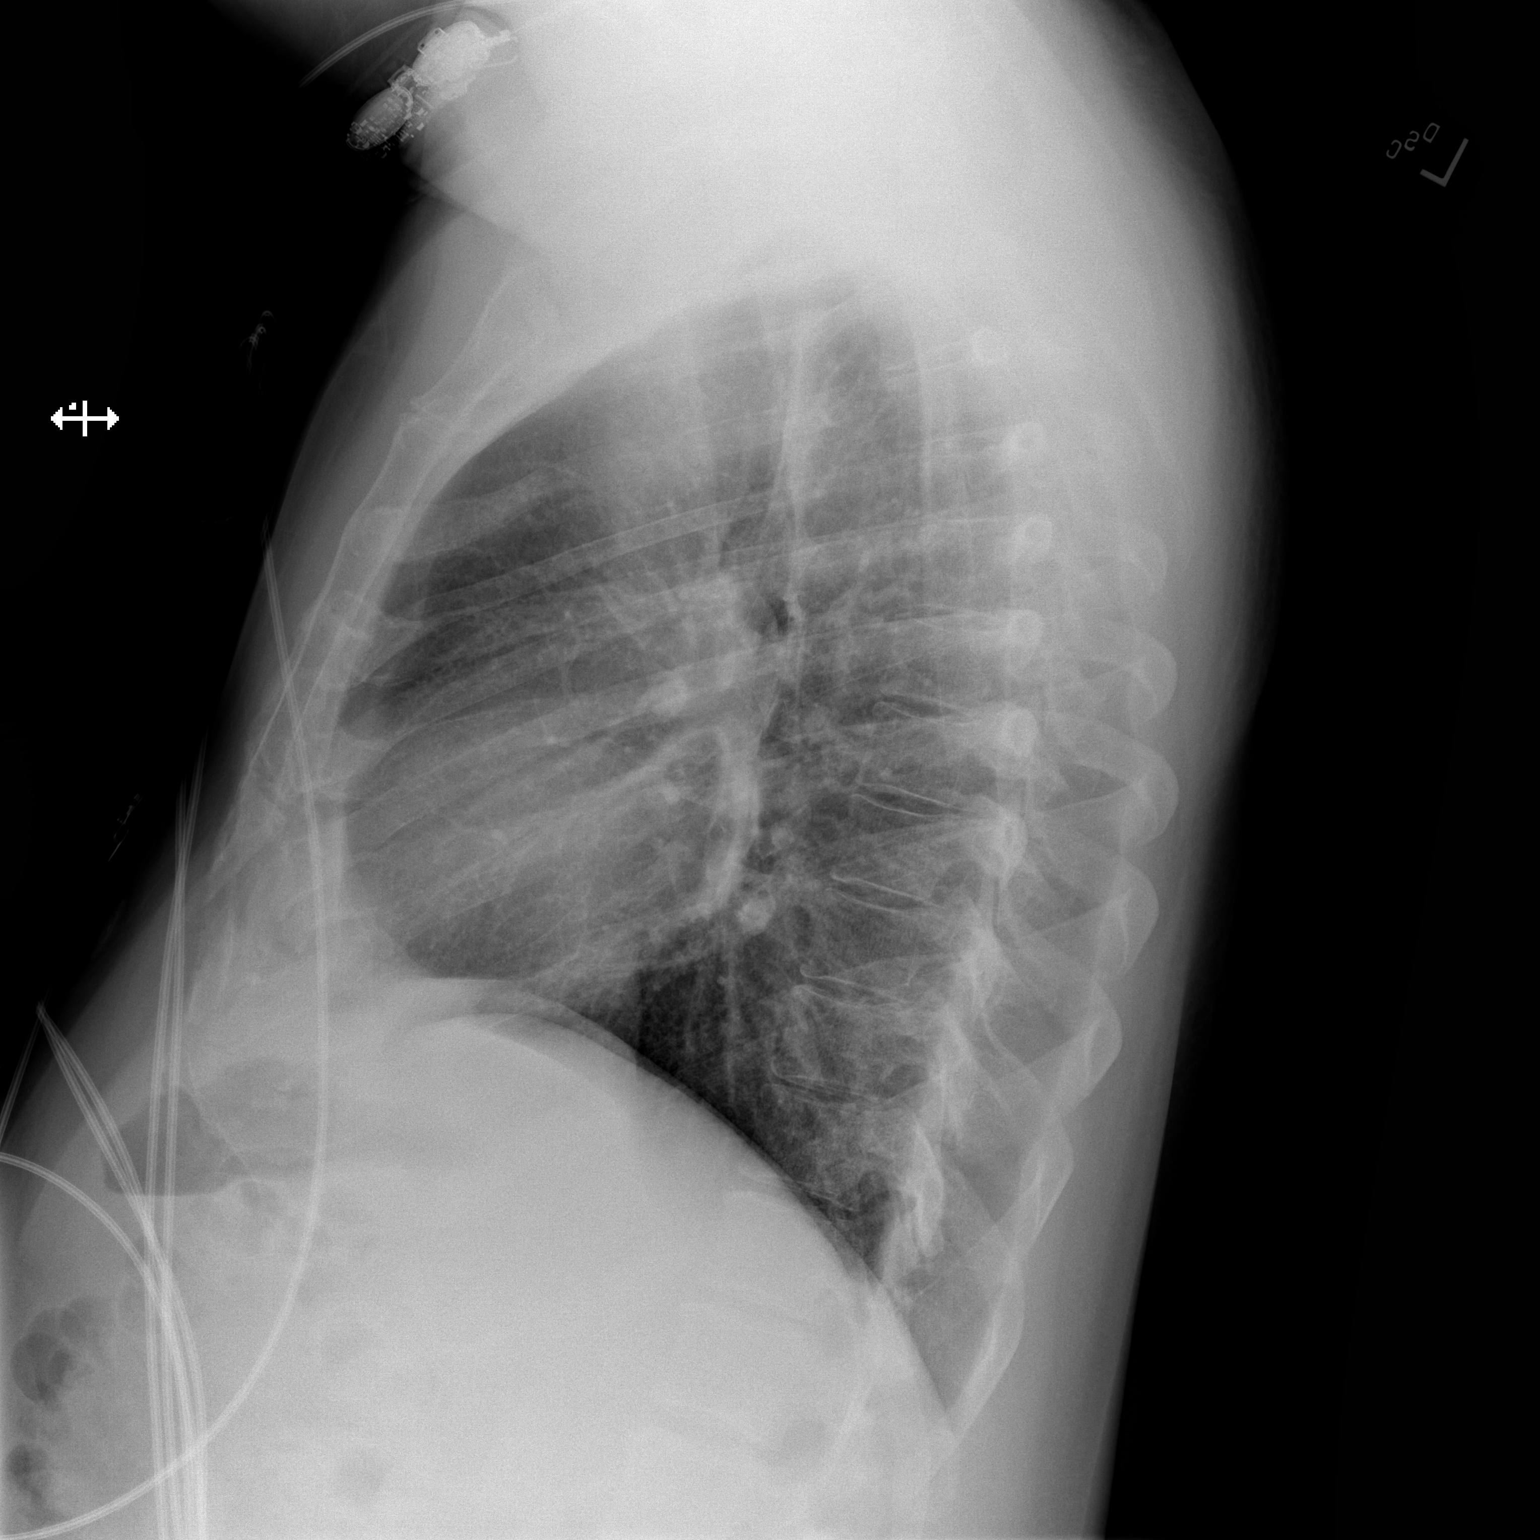

[2 of 2 positions shown; findings below may reference images not displayed]

FINDINGS: The heart size and mediastinal contours are within normal limits.
Both lungs are clear. The visualized skeletal structures are
unremarkable.
IMPRESSION: No acute abnormality of the lungs.

## 2024-01-27 ENCOUNTER — Other Ambulatory Visit: Payer: Self-pay | Admitting: Cardiology
